# Patient Record
Sex: Female | Born: 1960
Health system: Southern US, Community
[De-identification: ages and names within clinical notes are randomized; demographics above are authoritative.]

## PROBLEM LIST (undated history)

## (undated) DIAGNOSIS — E119 Type 2 diabetes mellitus without complications: Secondary | ICD-10-CM

## (undated) DIAGNOSIS — E039 Hypothyroidism, unspecified: Secondary | ICD-10-CM

## (undated) DIAGNOSIS — M1990 Unspecified osteoarthritis, unspecified site: Secondary | ICD-10-CM

## (undated) DIAGNOSIS — E785 Hyperlipidemia, unspecified: Secondary | ICD-10-CM

## (undated) DIAGNOSIS — E079 Disorder of thyroid, unspecified: Secondary | ICD-10-CM

## (undated) HISTORY — DX: Unspecified osteoarthritis, unspecified site: M19.90

## (undated) HISTORY — DX: Type 2 diabetes mellitus without complications: E11.9

## (undated) HISTORY — DX: Disorder of thyroid, unspecified: E07.9

## (undated) HISTORY — PX: TONSILLECTOMY AND ADENOIDECTOMY: SUR1326

## (undated) HISTORY — DX: Hypothyroidism, unspecified: E03.9

## (undated) HISTORY — PX: OTHER SURGICAL HISTORY: SHX169

## (undated) HISTORY — DX: Hyperlipidemia, unspecified: E78.5

## (undated) HISTORY — PX: WISDOM TOOTH EXTRACTION: SHX21

---

## 2001-06-11 ENCOUNTER — Encounter: Payer: Self-pay | Admitting: Orthopaedic Surgery

## 2001-06-11 ENCOUNTER — Ambulatory Visit (HOSPITAL_COMMUNITY): Admission: RE | Admit: 2001-06-11 | Discharge: 2001-06-11 | Payer: Self-pay | Admitting: Orthopaedic Surgery

## 2001-06-29 ENCOUNTER — Other Ambulatory Visit: Admission: RE | Admit: 2001-06-29 | Discharge: 2001-06-29 | Payer: Self-pay | Admitting: *Deleted

## 2003-07-10 ENCOUNTER — Ambulatory Visit (HOSPITAL_COMMUNITY): Admission: RE | Admit: 2003-07-10 | Discharge: 2003-07-10 | Payer: Self-pay | Admitting: Obstetrics and Gynecology

## 2004-08-08 ENCOUNTER — Ambulatory Visit (HOSPITAL_COMMUNITY): Admission: RE | Admit: 2004-08-08 | Discharge: 2004-08-08 | Payer: Self-pay | Admitting: Obstetrics and Gynecology

## 2004-12-10 ENCOUNTER — Emergency Department (HOSPITAL_COMMUNITY): Admission: EM | Admit: 2004-12-10 | Discharge: 2004-12-10 | Payer: Self-pay | Admitting: Emergency Medicine

## 2004-12-13 ENCOUNTER — Ambulatory Visit (HOSPITAL_COMMUNITY): Admission: RE | Admit: 2004-12-13 | Discharge: 2004-12-13 | Payer: Self-pay | Admitting: Orthopaedic Surgery

## 2004-12-17 ENCOUNTER — Encounter (HOSPITAL_COMMUNITY): Admission: RE | Admit: 2004-12-17 | Discharge: 2005-01-16 | Payer: Self-pay | Admitting: Orthopaedic Surgery

## 2005-01-17 ENCOUNTER — Encounter (HOSPITAL_COMMUNITY): Admission: RE | Admit: 2005-01-17 | Discharge: 2005-02-14 | Payer: Self-pay | Admitting: Orthopaedic Surgery

## 2005-02-18 ENCOUNTER — Encounter (HOSPITAL_COMMUNITY): Admission: RE | Admit: 2005-02-18 | Discharge: 2005-03-20 | Payer: Self-pay | Admitting: Orthopaedic Surgery

## 2005-09-16 ENCOUNTER — Ambulatory Visit (HOSPITAL_COMMUNITY): Admission: RE | Admit: 2005-09-16 | Discharge: 2005-09-16 | Payer: Self-pay | Admitting: Pediatrics

## 2006-06-25 ENCOUNTER — Ambulatory Visit (HOSPITAL_COMMUNITY): Admission: RE | Admit: 2006-06-25 | Discharge: 2006-06-25 | Payer: Self-pay | Admitting: Internal Medicine

## 2006-06-25 ENCOUNTER — Ambulatory Visit: Payer: Self-pay | Admitting: Internal Medicine

## 2006-06-25 ENCOUNTER — Encounter (INDEPENDENT_AMBULATORY_CARE_PROVIDER_SITE_OTHER): Payer: Self-pay | Admitting: Specialist

## 2006-10-08 ENCOUNTER — Ambulatory Visit: Payer: Self-pay | Admitting: Internal Medicine

## 2006-11-16 ENCOUNTER — Ambulatory Visit (HOSPITAL_COMMUNITY): Admission: RE | Admit: 2006-11-16 | Discharge: 2006-11-16 | Payer: Self-pay | Admitting: Pediatrics

## 2007-12-15 ENCOUNTER — Ambulatory Visit (HOSPITAL_COMMUNITY): Admission: RE | Admit: 2007-12-15 | Discharge: 2007-12-15 | Payer: Self-pay | Admitting: Internal Medicine

## 2009-03-27 ENCOUNTER — Ambulatory Visit (HOSPITAL_COMMUNITY): Admission: RE | Admit: 2009-03-27 | Discharge: 2009-03-27 | Payer: Self-pay | Admitting: Pediatrics

## 2010-10-04 NOTE — Op Note (Signed)
Jenna Carroll, Jenna Carroll                ACCOUNT NO.:  [id number]   MEDICAL RECORD NO.:  [id number]          PATIENT TYPE:  AMB   LOCATION:  DAY                           FACILITY:  [facility]   PHYSICIAN:  Lionel December, M.D.    DATE OF BIRTH:  02/03/1961   DATE OF PROCEDURE:  06/25/2006  DATE OF DISCHARGE:                                PROCEDURE NOTE   PROCEDURE:  Colonoscopy with terminal ileoscopy.   ENDOSCOPIST:  Lionel December, M.D.   INDICATION:  Jenna Carroll is a 50 year old Caucasian female who was recently  found to have heme-positive stools.  She presently does not have any  symptoms.  A few months ago she thought she had a single episode of  tarry stool, but she had been taking prescription Naprosyn, which she  uses sparingly.  Family history is negative for colorectal carcinoma.  Procedure and risks were reviewed with the patient and informed consent  was obtained.   MEDICATIONS FOR CONSCIOUS SEDATION:  Demerol 25 mg IV, Versed 6 mg IV,  Zofran 4 mg IV.   FINDINGS:  Procedure was performed in endoscopy suite.  The patient's  vital signs and O2 SATs were monitored during procedure and remained  stable.  The patient was placed in the left lateral decubitus position  and rectal exam was performed.  No abnormality was noted on external or  digital exam.  Pentax videoscope was placed in the rectum and advanced  under vision into sigmoid colon and beyond.  Preparation was excellent.  She had fine mucosal pigmentation consistent melanosis coli.  Scope was  passed into cecum, which was identified by ileocecal valve and  appendiceal orifice.  Mucosa of the cecum and ascending colon appeared  abnormal with a fleshy circumscribed areas, possibly lymphoid  hyperplasia.  Biopsy was taken for routine histology.  Short segment of  TL was also examined and was normal.  As the scope was withdrawn,  colonic mucosa was examined for the 2nd time and there were no polyps,  tumor masses or other mucosal  abnormalities.  Rectal mucosa was normal.  Scope was retroflexed to examine the anorectal junction, which was  unremarkable.  Endoscope was straightened and withdrawn.  The patient  tolerated the procedure well.   FINAL DIAGNOSES:  1. Normal terminal ileum.  2. Abnormal appearance to mucosa of cecum and ascending colon,      possible lymphoid hyperplasia.  Biopsy taken for routine histology.  3. No evidence of polyps, neoplasm or diverticulosis.   RECOMMENDATIONS:  I will be contacting the patient with results of  biopsy and further recommendations.   She should have H&H and Hemoccults in 3 months to make sure this is not  a recurrent or chronic process.      Lionel December, M.D.  Electronically Signed     NR/MEDQ  D:  06/25/2006  T:  06/25/2006  Job:  [id number]   cc:   Francoise Schaumann. Milford Cage DO, FAAP  Fax: [phone]

## 2012-02-17 DIAGNOSIS — E119 Type 2 diabetes mellitus without complications: Secondary | ICD-10-CM

## 2012-02-17 HISTORY — DX: Type 2 diabetes mellitus without complications: E11.9

## 2012-03-16 ENCOUNTER — Other Ambulatory Visit: Payer: Self-pay | Admitting: Family Medicine

## 2012-03-16 ENCOUNTER — Ambulatory Visit (INDEPENDENT_AMBULATORY_CARE_PROVIDER_SITE_OTHER): Payer: 59 | Admitting: Family Medicine

## 2012-03-16 ENCOUNTER — Encounter: Payer: Self-pay | Admitting: Family Medicine

## 2012-03-16 VITALS — BP 100/70 | HR 82 | Resp 15 | Ht 67.0 in | Wt 203.1 lb

## 2012-03-16 DIAGNOSIS — E039 Hypothyroidism, unspecified: Secondary | ICD-10-CM

## 2012-03-16 DIAGNOSIS — E0590 Thyrotoxicosis, unspecified without thyrotoxic crisis or storm: Secondary | ICD-10-CM

## 2012-03-16 DIAGNOSIS — E559 Vitamin D deficiency, unspecified: Secondary | ICD-10-CM

## 2012-03-16 DIAGNOSIS — Z13220 Encounter for screening for lipoid disorders: Secondary | ICD-10-CM

## 2012-03-16 DIAGNOSIS — M129 Arthropathy, unspecified: Secondary | ICD-10-CM

## 2012-03-16 DIAGNOSIS — E669 Obesity, unspecified: Secondary | ICD-10-CM

## 2012-03-16 DIAGNOSIS — Z1239 Encounter for other screening for malignant neoplasm of breast: Secondary | ICD-10-CM

## 2012-03-16 DIAGNOSIS — M1990 Unspecified osteoarthritis, unspecified site: Secondary | ICD-10-CM | POA: Insufficient documentation

## 2012-03-16 LAB — LIPID PANEL
Cholesterol: 209 mg/dL — ABNORMAL HIGH (ref 0–200)
LDL Cholesterol: 122 mg/dL — ABNORMAL HIGH (ref 0–99)
VLDL: 7 mg/dL (ref 0–40)

## 2012-03-16 LAB — COMPREHENSIVE METABOLIC PANEL
ALT: 13 U/L (ref 0–35)
CO2: 26 mEq/L (ref 19–32)
Sodium: 136 mEq/L (ref 135–145)
Total Bilirubin: 1.3 mg/dL — ABNORMAL HIGH (ref 0.3–1.2)
Total Protein: 6.7 g/dL (ref 6.0–8.3)

## 2012-03-16 LAB — CBC
Hemoglobin: 14 g/dL (ref 12.0–15.0)
MCH: 31.7 pg (ref 26.0–34.0)
RBC: 4.41 MIL/uL (ref 3.87–5.11)
WBC: 4.1 10*3/uL (ref 4.0–10.5)

## 2012-03-16 LAB — TSH: TSH: 2.275 u[IU]/mL (ref 0.350–4.500)

## 2012-03-16 MED ORDER — LEVOTHYROXINE SODIUM 137 MCG PO TABS: 137.0000 ug | ORAL_TABLET | Freq: Every day | ORAL | Status: DC

## 2012-03-16 NOTE — Assessment & Plan Note (Signed)
Discussed importance of diet and exercise, she is motivated to restart her exercise routine.  Goal set for next visit 20lbs weight loss

## 2012-03-16 NOTE — Assessment & Plan Note (Signed)
Vitamin D deficient in the past. She's to start calcium and vitamin D

## 2012-03-16 NOTE — Progress Notes (Signed)
  Subjective:    Patient ID: Jenna Carroll, female    DOB: Feb 04, 1961, 51 y.o.   MRN: [id number]  HPI Patient here to establish care. Previous PCP Dr. Stephania Fragmin at [facility]. [facility] Medications and history reviewed. Patient is a Garment/textile technologist at [employer]. Hypothyroidism. Patient was diagnosed with Hashimoto's in 2007. She's been well-controlled on levothyroxine she does better on the generic brand. Rheumatoid arthritis. Patient states she was diagnosed the mild form of rheumatoid arthritis. She had a rheumatoid attack in her right eye which was evaluated by ophthalmology a few years ago. She subsequently had markers done which were positive rheumatoid factor she was evaluated by a rheumatologist however which showed a severe mild disease and that she could use anti-inflammatories. She was given prescription for Celebrex however has not had this filled. She typically uses Aleve   Review of Systems  GEN- denies fatigue, fever, weight loss,weakness, recent illness HEENT- denies eye drainage, change in vision, nasal discharge, CVS- denies chest pain, palpitations RESP- denies SOB, cough, wheeze ABD- denies N/V, change in stools, abd pain GU- denies dysuria, hematuria, dribbling, incontinence MSK- denies joint pain, muscle aches, injury Neuro- denies headache, dizziness, syncope, seizure activity      Objective:   Physical Exam GEN- NAD, alert and oriented x3 HEENT- PERRL, EOMI, non injected sclera, pink conjunctiva, MMM, oropharynx clear Neck- Supple, no thyromegaly CVS- RRR, no murmur RESP-CTAB ABS-NABS,soft,NT,ND EXT- No edema Pulses- Radial, DP- 2+ Psych- normal affect and mood        Assessment & Plan:

## 2012-03-16 NOTE — Assessment & Plan Note (Signed)
When necessary use of over-the-counter anti-inflammatories. We will use Celebrex if needed

## 2012-03-16 NOTE — Assessment & Plan Note (Signed)
Check baseline labs including thyroid studies. Continue Synthroid

## 2012-03-16 NOTE — Patient Instructions (Signed)
Get the fasting labs done Continue current medications Add Calcium and Vit D (800IU)  Schedule Mammogram F/U Dr. Emelda Fear for PAP Smear  F/U 6 months

## 2012-03-17 LAB — HEMOGLOBIN A1C
Hgb A1c MFr Bld: 7 % — ABNORMAL HIGH (ref <5.7)
Mean Plasma Glucose: 154 mg/dL — ABNORMAL HIGH (ref <117)

## 2012-03-18 ENCOUNTER — Telehealth: Payer: Self-pay | Admitting: Family Medicine

## 2012-03-18 ENCOUNTER — Encounter: Payer: Self-pay | Admitting: Family Medicine

## 2012-03-18 MED ORDER — METFORMIN HCL ER 500 MG PO TB24: 500.0000 mg | ORAL_TABLET | Freq: Every day | ORAL | Status: DC

## 2012-03-18 NOTE — Telephone Encounter (Signed)
Spoke with patient. Review labs new diagnosis of diabetes mellitus with an A1c of 7%. She also has mild hyperlipidemia with an LDL of 122. She started an exercise program per her recent visit. I will start her on metformin 500 mg daily. She is committed to changing her diet and increase in activity in order to control her diabetes mellitus. I believe with the changes we will be able to eventually get her off of oral medications and controlled with diet. She will test her blood sugars 3 times a week fasting and record. She's to call for any hyperglycemia or hypoglycemia. We will recheck A1c in 12 weeks. With new diagnosis and plan per above we will hold on addition of statin therapy at this time

## 2012-04-14 ENCOUNTER — Other Ambulatory Visit (HOSPITAL_COMMUNITY)
Admission: RE | Admit: 2012-04-14 | Discharge: 2012-04-14 | Disposition: A | Discharge status: Home / Self Care | Payer: 59 | Source: Ambulatory Visit | Attending: Obstetrics and Gynecology | Admitting: Obstetrics and Gynecology

## 2012-04-14 ENCOUNTER — Other Ambulatory Visit: Payer: Self-pay | Admitting: Adult Health

## 2012-04-14 DIAGNOSIS — Z01419 Encounter for gynecological examination (general) (routine) without abnormal findings: Secondary | ICD-10-CM | POA: Insufficient documentation

## 2012-04-14 DIAGNOSIS — Z1151 Encounter for screening for human papillomavirus (HPV): Secondary | ICD-10-CM | POA: Insufficient documentation

## 2012-09-16 ENCOUNTER — Other Ambulatory Visit: Payer: Self-pay | Admitting: Family Medicine

## 2012-09-20 ENCOUNTER — Other Ambulatory Visit: Payer: Self-pay | Admitting: Family Medicine

## 2012-09-20 DIAGNOSIS — E785 Hyperlipidemia, unspecified: Secondary | ICD-10-CM

## 2012-09-20 DIAGNOSIS — R7303 Prediabetes: Secondary | ICD-10-CM

## 2012-09-20 DIAGNOSIS — E039 Hypothyroidism, unspecified: Secondary | ICD-10-CM

## 2012-09-22 ENCOUNTER — Telehealth: Payer: Self-pay | Admitting: Family Medicine

## 2012-09-22 NOTE — Addendum Note (Signed)
Addended by: Kandis Fantasia B on: 09/22/2012 02:24 PM   Modules accepted: Orders

## 2012-09-22 NOTE — Progress Notes (Signed)
Called and let msg for patient.  Notified her that she does need to complete labs prior to appt and they are fasting.  Also notified her that the manufacturer of her levothyroxine has changed and she may see a change in the pill.

## 2012-09-22 NOTE — Telephone Encounter (Signed)
See previous msg for response.

## 2012-09-23 LAB — BASIC METABOLIC PANEL
CO2: 22 mEq/L (ref 19–32)
Glucose, Bld: 88 mg/dL (ref 70–99)
Potassium: 3.9 mEq/L (ref 3.5–5.3)
Sodium: 136 mEq/L (ref 135–145)

## 2012-09-23 LAB — HEMOGLOBIN A1C
Hgb A1c MFr Bld: 6.4 % — ABNORMAL HIGH (ref <5.7)
Mean Plasma Glucose: 137 mg/dL — ABNORMAL HIGH (ref <117)

## 2012-09-24 ENCOUNTER — Ambulatory Visit (HOSPITAL_COMMUNITY): Payer: 59

## 2012-09-24 ENCOUNTER — Ambulatory Visit (HOSPITAL_COMMUNITY)
Admission: RE | Admit: 2012-09-24 | Discharge: 2012-09-24 | Disposition: A | Discharge status: Home / Self Care | Payer: 59 | Source: Ambulatory Visit | Attending: Family Medicine | Admitting: Family Medicine

## 2012-09-24 DIAGNOSIS — Z1239 Encounter for other screening for malignant neoplasm of breast: Secondary | ICD-10-CM

## 2012-09-24 DIAGNOSIS — Z1231 Encounter for screening mammogram for malignant neoplasm of breast: Secondary | ICD-10-CM | POA: Insufficient documentation

## 2012-09-28 ENCOUNTER — Encounter: Payer: Self-pay | Admitting: Family Medicine

## 2012-09-28 ENCOUNTER — Ambulatory Visit (INDEPENDENT_AMBULATORY_CARE_PROVIDER_SITE_OTHER): Payer: 59 | Admitting: Family Medicine

## 2012-09-28 VITALS — BP 114/78 | HR 79 | Resp 16 | Wt 193.0 lb

## 2012-09-28 DIAGNOSIS — R809 Proteinuria, unspecified: Secondary | ICD-10-CM

## 2012-09-28 DIAGNOSIS — D239 Other benign neoplasm of skin, unspecified: Secondary | ICD-10-CM

## 2012-09-28 DIAGNOSIS — E669 Obesity, unspecified: Secondary | ICD-10-CM

## 2012-09-28 DIAGNOSIS — D229 Melanocytic nevi, unspecified: Secondary | ICD-10-CM

## 2012-09-28 DIAGNOSIS — N390 Urinary tract infection, site not specified: Secondary | ICD-10-CM

## 2012-09-28 DIAGNOSIS — E039 Hypothyroidism, unspecified: Secondary | ICD-10-CM

## 2012-09-28 DIAGNOSIS — R7302 Impaired glucose tolerance (oral): Secondary | ICD-10-CM

## 2012-09-28 DIAGNOSIS — E119 Type 2 diabetes mellitus without complications: Secondary | ICD-10-CM

## 2012-09-28 LAB — POCT URINALYSIS DIPSTICK
Bilirubin, UA: NEGATIVE
Ketones, UA: NEGATIVE
Nitrite, UA: POSITIVE
Protein, UA: 100

## 2012-09-28 MED ORDER — LEVOTHYROXINE SODIUM 137 MCG PO TABS: ORAL_TABLET | ORAL | Status: DC

## 2012-09-28 MED ORDER — CEPHALEXIN 500 MG PO CAPS: 500.0000 mg | ORAL_CAPSULE | Freq: Two times a day (BID) | ORAL | Status: DC

## 2012-09-28 NOTE — Patient Instructions (Addendum)
Continue to work on weight loss Current BMI 30 Cholesterol looks great You can check CBG Fasting a few times a week Dermatology referral Keflex for UTI F/U 6 months

## 2012-09-29 DIAGNOSIS — N390 Urinary tract infection, site not specified: Secondary | ICD-10-CM | POA: Insufficient documentation

## 2012-09-29 DIAGNOSIS — R809 Proteinuria, unspecified: Secondary | ICD-10-CM | POA: Insufficient documentation

## 2012-09-29 DIAGNOSIS — D229 Melanocytic nevi, unspecified: Secondary | ICD-10-CM | POA: Insufficient documentation

## 2012-09-29 DIAGNOSIS — E119 Type 2 diabetes mellitus without complications: Secondary | ICD-10-CM | POA: Insufficient documentation

## 2012-09-29 LAB — MICROALBUMIN / CREATININE URINE RATIO
Microalb Creat Ratio: 113.5 mg/g — ABNORMAL HIGH (ref 0.0–30.0)
Microalb, Ur: 19.01 mg/dL — ABNORMAL HIGH (ref 0.00–1.89)

## 2012-09-29 MED ORDER — LISINOPRIL 5 MG PO TABS: 5.0000 mg | ORAL_TABLET | Freq: Every day | ORAL | Status: DC

## 2012-09-29 NOTE — Progress Notes (Signed)
  Subjective:    Patient ID: KESTREL MIS, female    DOB: [birth year], 53 y.o.   MRN: [id number]  HPI  Pt here to f/u chronic medical problems DM- fastings 90- low 100's, did not tolerate Metformin after 6 weeks still had diarrhea. Has been watching diet and losing weight. Weight was done to 185lbs. Dysuria past 3 days with frequency, no fever, no abd pain Request referral to have moles looked at by dermatology Fasting labs reviewed  Review of Systems  GEN- denies fatigue, fever, weight loss,weakness, recent illness HEENT- denies eye drainage, change in vision, nasal discharge, CVS- denies chest pain, palpitations RESP- denies SOB, cough, wheeze ABD- denies N/V, change in stools, abd pain GU- + dysuria,denies  hematuria, dribbling, incontinence MSK- denies joint pain, muscle aches, injury Neuro- denies headache, dizziness, syncope, seizure activity      Objective:   Physical Exam GEN- NAD, alert and oriented x3 HEENT- PERRL, EOMI, non injected sclera, pink conjunctiva, MMM, oropharynx clear Neck- Supple, no thyromegaly CVS- RRR, no murmur RESP-CTAB ABD-NABS,soft,NT,ND, no CVA tenderness EXT- No edema Pulses- Radial, DP- 2+ Skin-brown macules, few keratosis, raised lesion on left leg       Assessment & Plan:

## 2012-09-29 NOTE — Assessment & Plan Note (Signed)
Referral to dermatology.  

## 2012-09-29 NOTE — Assessment & Plan Note (Signed)
Start low dose ACEI, recheck 6 months Renal function normal

## 2012-09-29 NOTE — Assessment & Plan Note (Signed)
Diet controlled, did not tolerate metformin

## 2012-09-29 NOTE — Assessment & Plan Note (Signed)
No change to dose

## 2012-09-29 NOTE — Assessment & Plan Note (Signed)
Weight loss noted, she continues to work on [employer] and diet Very motivated

## 2012-09-29 NOTE — Assessment & Plan Note (Signed)
Keflex given

## 2012-10-14 ENCOUNTER — Emergency Department (HOSPITAL_COMMUNITY)
Admission: EM | Admit: 2012-10-14 | Discharge: 2012-10-14 | Disposition: A | Discharge status: Home / Self Care | Payer: PRIVATE HEALTH INSURANCE | Attending: Emergency Medicine | Admitting: Emergency Medicine

## 2012-10-14 ENCOUNTER — Encounter (HOSPITAL_COMMUNITY): Payer: Self-pay | Admitting: *Deleted

## 2012-10-14 DIAGNOSIS — R209 Unspecified disturbances of skin sensation: Secondary | ICD-10-CM | POA: Insufficient documentation

## 2012-10-14 DIAGNOSIS — Z862 Personal history of diseases of the blood and blood-forming organs and certain disorders involving the immune mechanism: Secondary | ICD-10-CM | POA: Insufficient documentation

## 2012-10-14 DIAGNOSIS — E119 Type 2 diabetes mellitus without complications: Secondary | ICD-10-CM | POA: Insufficient documentation

## 2012-10-14 DIAGNOSIS — Y939 Activity, unspecified: Secondary | ICD-10-CM | POA: Insufficient documentation

## 2012-10-14 DIAGNOSIS — E079 Disorder of thyroid, unspecified: Secondary | ICD-10-CM | POA: Insufficient documentation

## 2012-10-14 DIAGNOSIS — Z79899 Other long term (current) drug therapy: Secondary | ICD-10-CM | POA: Insufficient documentation

## 2012-10-14 DIAGNOSIS — IMO0002 Reserved for concepts with insufficient information to code with codable children: Secondary | ICD-10-CM | POA: Insufficient documentation

## 2012-10-14 DIAGNOSIS — Z8739 Personal history of other diseases of the musculoskeletal system and connective tissue: Secondary | ICD-10-CM | POA: Insufficient documentation

## 2012-10-14 DIAGNOSIS — Y9289 Other specified places as the place of occurrence of the external cause: Secondary | ICD-10-CM | POA: Insufficient documentation

## 2012-10-14 DIAGNOSIS — S79919A Unspecified injury of unspecified hip, initial encounter: Secondary | ICD-10-CM | POA: Insufficient documentation

## 2012-10-14 DIAGNOSIS — Y990 Civilian activity done for income or pay: Secondary | ICD-10-CM | POA: Insufficient documentation

## 2012-10-14 DIAGNOSIS — R202 Paresthesia of skin: Secondary | ICD-10-CM

## 2012-10-14 DIAGNOSIS — Z8639 Personal history of other endocrine, nutritional and metabolic disease: Secondary | ICD-10-CM | POA: Insufficient documentation

## 2012-10-14 NOTE — ED Notes (Signed)
Bumped into a piece of equipment while at work, twisting the wrong way.  C/o severe pain to left hip radiating down left leg.  States felt numbness, feeling/pain coming back with movement.

## 2012-10-14 NOTE — ED Notes (Addendum)
Charted in error.

## 2012-10-14 NOTE — ED Provider Notes (Signed)
History     This chart was scribed for Joya Gaskins, MD, MD by Smitty Pluck, ED Scribe. The patient was seen in room APA02/APA02 and the patient's care was started at 10:50 AM.   CSN: [id number]  Arrival date & time 10/14/12  1032      Chief Complaint  Patient presents with  . Leg Pain     Patient is a 52 y.o. female presenting with leg pain. The history is provided by the patient and medical records. No language interpreter was used.  Leg Pain Location:  Hip Time since incident:  15 minutes Hip location:  L hip Pain details:    Radiates to:  L leg   Severity:  Severe   Onset quality:  Sudden   Timing:  Intermittent   Progression:  Resolved Chronicity:  New Dislocation: no   Foreign body present:  No foreign bodies Ineffective treatments:  None tried Associated symptoms: no back pain and no fever    HPI Comments: Jenna Carroll is a 52 y.o. female who presents to the Emergency Department complaining of intermittent, severe left hip pain onset today 15 minutes ago suddenly after hitting handle of laser machine with left hip today at work. She reports hip pain radiated to left leg and numbness in left leg. Pt mentions that her symptoms are improving and she does not currently have any pain. She reports hx of left leg pain. Pt denies fall, fever, chills, nausea, vomiting, diarrhea, weakness, cough, SOB and any other pain.   Past Medical History  Diagnosis Date  . Thyroid disease   . Arthritis   . DM (diabetes mellitus) Oct 2013  . Hyperlipidemia, mild     Past Surgical History  Procedure Laterality Date  . Left ankle    . Tonsillectomy and adenoidectomy      Family History  Problem Relation Age of Onset  . Diabetes Mother   . Heart disease Mother   . Hypertension Mother   . Hyperlipidemia Mother   . Heart disease Father   . Arthritis Father     Rheumatoid  . Hyperlipidemia Brother     History  Substance Use Topics  . Smoking status: Never Smoker   .  Smokeless tobacco: Not on file  . Alcohol Use: Yes     Comment: rare    OB History   Grav Para Term Preterm Abortions TAB SAB Ect Mult Living                  Review of Systems  Constitutional: Negative for fever and chills.  Musculoskeletal: Positive for arthralgias. Negative for back pain.  Neurological: Positive for numbness.  All other systems reviewed and are negative.    Allergies  Review of patient's allergies indicates no known allergies.  Home Medications   Current Outpatient Rx  Name  Route  Sig  Dispense  Refill  . cephALEXin (KEFLEX) 500 MG capsule   Oral   Take 1 capsule (500 mg total) by mouth 2 (two) times daily.   10 capsule   0   . levothyroxine (SYNTHROID, LEVOTHROID) 137 MCG tablet      TAKE ONE TABLET BY MOUTH EVERY DAY   90 tablet   2   . lisinopril (PRINIVIL,ZESTRIL) 5 MG tablet   Oral   Take 1 tablet (5 mg total) by mouth daily.   30 tablet   6     BP 142/89  Pulse 85  Temp(Src) 98.4 F (36.9 C) (Oral)  Resp 18  Ht 5\' 7"  (1.702 m)  Wt 190 lb (86.183 kg)  BMI 29.75 kg/m2  SpO2 99%  Physical Exam  Nursing note and vitals reviewed. CONSTITUTIONAL: Well developed/well nourished HEAD: Normocephalic/atraumatic EYES: EOMI/PERRL ENMT: Mucous membranes moist NECK: supple no meningeal signs SPINE:entire spine nontender, No bruising/crepitance/stepoffs noted to spine CV: S1/S2 noted, no murmurs/rubs/gallops noted LUNGS: Lungs are clear to auscultation bilaterally, no apparent distress ABDOMEN: soft, nontender, no rebound or guarding GU:no cva tenderness NEURO: Pt is awake/alert, paresthesia throughout left leg but able to move toes EXTREMITIES: pulses normal, left buttock pain. No bruising SKIN: warm, color normal PSYCH: no abnormalities of mood noted   ED Course  Procedures ( DIAGNOSTIC STUDIES: Oxygen Saturation is 99% on room air, normal by my interpretation.    COORDINATION OF CARE: 10:54 AM Discussed ED treatment with pt  and pt agrees.   11:41 AM Pt with abrupt onset of paresthesias after hitting object at work (no falls reported Pt feeling improved, able move the left LE  And reports sensation is returning   Symptoms nearly resolved She is ambulatory She can move the left hip without significant reproduction of pain She can flex hip, flex/extend left knee and plantar/dorsi flex left foot She does not want pain meds She has seen dr Hilda Lias previously and would like to see him again  MDM  Nursing notes including past medical history and social history reviewed and considered in documentation       I personally performed the services described in this documentation, which was scribed in my presence. The recorded information has been reviewed and is accurate.      Joya Gaskins, MD 10/14/12 1324

## 2012-11-17 ENCOUNTER — Other Ambulatory Visit (HOSPITAL_COMMUNITY): Payer: Self-pay | Admitting: Orthopaedic Surgery

## 2012-11-17 DIAGNOSIS — M545 Low back pain: Secondary | ICD-10-CM

## 2012-11-22 ENCOUNTER — Ambulatory Visit (HOSPITAL_COMMUNITY)
Admission: RE | Admit: 2012-11-22 | Discharge: 2012-11-22 | Disposition: A | Discharge status: Home / Self Care | Payer: PRIVATE HEALTH INSURANCE | Source: Ambulatory Visit | Attending: Orthopaedic Surgery | Admitting: Orthopaedic Surgery

## 2012-11-22 DIAGNOSIS — M545 Low back pain, unspecified: Secondary | ICD-10-CM | POA: Insufficient documentation

## 2012-11-22 DIAGNOSIS — M47817 Spondylosis without myelopathy or radiculopathy, lumbosacral region: Secondary | ICD-10-CM | POA: Insufficient documentation

## 2013-03-24 ENCOUNTER — Other Ambulatory Visit: Payer: Self-pay

## 2015-06-21 ENCOUNTER — Other Ambulatory Visit (HOSPITAL_COMMUNITY): Payer: Self-pay | Admitting: Internal Medicine

## 2015-06-21 DIAGNOSIS — Z1231 Encounter for screening mammogram for malignant neoplasm of breast: Secondary | ICD-10-CM

## 2015-06-22 ENCOUNTER — Ambulatory Visit (HOSPITAL_COMMUNITY)
Admission: RE | Admit: 2015-06-22 | Discharge: 2015-06-22 | Disposition: A | Discharge status: Home / Self Care | Payer: 59 | Source: Ambulatory Visit | Attending: Internal Medicine | Admitting: Internal Medicine

## 2015-06-22 DIAGNOSIS — Z1231 Encounter for screening mammogram for malignant neoplasm of breast: Secondary | ICD-10-CM | POA: Diagnosis not present

## 2015-08-06 MED FILL — LEVOTHYROXINE 150 MCG TAB: 150 | 90 days supply | Qty: 90 | Fill #1

## 2015-09-12 DIAGNOSIS — E559 Vitamin D deficiency, unspecified: Secondary | ICD-10-CM | POA: Diagnosis not present

## 2015-09-12 DIAGNOSIS — E119 Type 2 diabetes mellitus without complications: Secondary | ICD-10-CM | POA: Diagnosis not present

## 2015-09-12 DIAGNOSIS — E039 Hypothyroidism, unspecified: Secondary | ICD-10-CM | POA: Diagnosis not present

## 2015-10-29 MED FILL — LEVOTHYROXINE 150 MCG TAB: 150 | 90 days supply | Qty: 90 | Fill #0

## 2015-11-07 DIAGNOSIS — E039 Hypothyroidism, unspecified: Secondary | ICD-10-CM | POA: Diagnosis not present

## 2015-11-07 DIAGNOSIS — E119 Type 2 diabetes mellitus without complications: Secondary | ICD-10-CM | POA: Diagnosis not present

## 2015-11-07 DIAGNOSIS — E669 Obesity, unspecified: Secondary | ICD-10-CM | POA: Diagnosis not present

## 2015-11-07 MED FILL — CELECOXIB 200 MG CAPSULE: 200 | 30 days supply | Qty: 30 | Fill #0

## 2015-11-07 MED FILL — METAXALONE 800 MG TABLET: 800 | 10 days supply | Qty: 30 | Fill #0

## 2016-01-30 DIAGNOSIS — H524 Presbyopia: Secondary | ICD-10-CM | POA: Diagnosis not present

## 2016-02-04 MED FILL — LEVOTHYROXINE 150 MCG TAB: 150 | 90 days supply | Qty: 90 | Fill #1

## 2016-02-12 DIAGNOSIS — Z713 Dietary counseling and surveillance: Secondary | ICD-10-CM | POA: Diagnosis not present

## 2016-02-12 DIAGNOSIS — E559 Vitamin D deficiency, unspecified: Secondary | ICD-10-CM | POA: Diagnosis not present

## 2016-02-12 DIAGNOSIS — E039 Hypothyroidism, unspecified: Secondary | ICD-10-CM | POA: Diagnosis not present

## 2016-02-12 DIAGNOSIS — E119 Type 2 diabetes mellitus without complications: Secondary | ICD-10-CM | POA: Diagnosis not present

## 2016-02-12 MED FILL — TRUE METRIX GLUCOSE TEST ST: 90 days supply | Qty: 100 | Fill #1

## 2016-02-12 MED FILL — TRUEplus LANCETS 30G MISC: 90 days supply | Qty: 100 | Fill #1

## 2016-03-11 DIAGNOSIS — D2271 Melanocytic nevi of right lower limb, including hip: Secondary | ICD-10-CM | POA: Diagnosis not present

## 2016-03-11 DIAGNOSIS — D2372 Other benign neoplasm of skin of left lower limb, including hip: Secondary | ICD-10-CM | POA: Diagnosis not present

## 2016-03-11 DIAGNOSIS — D2262 Melanocytic nevi of left upper limb, including shoulder: Secondary | ICD-10-CM | POA: Diagnosis not present

## 2016-03-11 DIAGNOSIS — H903 Sensorineural hearing loss, bilateral: Secondary | ICD-10-CM | POA: Diagnosis not present

## 2016-03-11 DIAGNOSIS — D225 Melanocytic nevi of trunk: Secondary | ICD-10-CM | POA: Diagnosis not present

## 2016-03-11 DIAGNOSIS — D2261 Melanocytic nevi of right upper limb, including shoulder: Secondary | ICD-10-CM | POA: Diagnosis not present

## 2016-03-11 DIAGNOSIS — D2272 Melanocytic nevi of left lower limb, including hip: Secondary | ICD-10-CM | POA: Diagnosis not present

## 2016-03-11 DIAGNOSIS — L821 Other seborrheic keratosis: Secondary | ICD-10-CM | POA: Diagnosis not present

## 2016-03-11 DIAGNOSIS — D1801 Hemangioma of skin and subcutaneous tissue: Secondary | ICD-10-CM | POA: Diagnosis not present

## 2016-03-26 DIAGNOSIS — H903 Sensorineural hearing loss, bilateral: Secondary | ICD-10-CM | POA: Diagnosis not present

## 2016-04-08 ENCOUNTER — Other Ambulatory Visit (HOSPITAL_COMMUNITY)
Admission: RE | Admit: 2016-04-08 | Discharge: 2016-04-08 | Disposition: A | Discharge status: Home / Self Care | Payer: 59 | Source: Ambulatory Visit | Attending: Adult Health | Admitting: Adult Health

## 2016-04-08 ENCOUNTER — Encounter: Payer: Self-pay | Admitting: Adult Health

## 2016-04-08 ENCOUNTER — Ambulatory Visit (INDEPENDENT_AMBULATORY_CARE_PROVIDER_SITE_OTHER): Payer: 59 | Admitting: Adult Health

## 2016-04-08 VITALS — BP 102/60 | HR 58 | Ht 67.5 in | Wt 185.0 lb

## 2016-04-08 DIAGNOSIS — Z1211 Encounter for screening for malignant neoplasm of colon: Secondary | ICD-10-CM

## 2016-04-08 DIAGNOSIS — Z01419 Encounter for gynecological examination (general) (routine) without abnormal findings: Secondary | ICD-10-CM | POA: Diagnosis not present

## 2016-04-08 DIAGNOSIS — Z780 Asymptomatic menopausal state: Secondary | ICD-10-CM

## 2016-04-08 DIAGNOSIS — Z1151 Encounter for screening for human papillomavirus (HPV): Secondary | ICD-10-CM | POA: Insufficient documentation

## 2016-04-08 DIAGNOSIS — Z975 Presence of (intrauterine) contraceptive device: Secondary | ICD-10-CM | POA: Insufficient documentation

## 2016-04-08 LAB — HEMOCCULT GUIAC POC 1CARD (OFFICE): FECAL OCCULT BLD: NEGATIVE

## 2016-04-08 NOTE — Progress Notes (Signed)
Patient ID: Jenna Carroll, female   DOB: 1960-11-27, 55 y.o.   MRN: [id number] History of Present Illness:  Jenna Carroll is a 55 year old white female, married in for well woman gyn exam and pap.Last pap was 04/14/12 and was normal with negative HPV. PCP is Dr Anastasio Champion.   Current Medications, Allergies, Past Medical History, Past Surgical History, Family History and Social History were reviewed in [employer] record.     Review of Systems: Patient denies any headaches, hearing loss, fatigue, blurred vision, shortness of breath, chest pain, abdominal pain, problems with bowel movements, urination, or intercourse. No joint pain or mood swings.Skipping periods now, has para gard IUD for about 20 years now.    Physical Exam:BP 102/60 (BP Location: Left Arm, Patient Position: Sitting, Cuff Size: Normal)   Pulse (!) 58   Ht 5' 7.5" (1.715 m)   Wt 185 lb (83.9 kg)   LMP 11/07/2015   BMI 28.55 kg/m  General:  Well developed, well nourished, no acute distress Skin:  Warm and dry Neck:  Midline trachea, normal thyroid, good ROM, no lymphadenopathy Lungs; Clear to auscultation bilaterally Breast:  No dominant palpable mass, retraction, or nipple discharge Cardiovascular: Regular rate and rhythm Abdomen:  Soft, non tender, no hepatosplenomegaly Pelvic:  External genitalia is normal in appearance, no lesions.  The vagina is normal in appearance. Urethra has no lesions or masses. The cervix is smooth, +IUD strings at os, pap with HPV performed.  Uterus is felt to be normal size, shape, and contour.  No adnexal masses or tenderness noted.Bladder is non tender, no masses felt. Rectal: Good sphincter tone, no polyps, or hemorrhoids felt.  Hemoccult negative. Extremities/musculoskeletal:  No swelling or varicosities noted, no clubbing or cyanosis Psych:  No mood changes, alert and cooperative,seems happy PHQ 2 score 0.  Impression: 1. Encounter for gynecological examination with  Papanicolaou smear of cervix   2. IUD (intrauterine device) in place   3. Menopause      Plan: Check FSH, if PM can remove IUD Physical in 1 year, pap in 3 if normal  Mammogram yearly Colonoscopy 2018 Labs with PCP

## 2016-04-08 NOTE — Patient Instructions (Signed)
Physical in 1 year, pap in 3 if normal  Get FSH,will talk when results back Mammogram yearly Colonoscopy 2018 Labs with PCP

## 2016-04-14 LAB — CYTOLOGY - PAP
Diagnosis: NEGATIVE
HPV: NOT DETECTED

## 2016-04-29 DIAGNOSIS — M79672 Pain in left foot: Secondary | ICD-10-CM | POA: Diagnosis not present

## 2016-04-29 DIAGNOSIS — E119 Type 2 diabetes mellitus without complications: Secondary | ICD-10-CM | POA: Diagnosis not present

## 2016-04-29 DIAGNOSIS — M7750 Other enthesopathy of unspecified foot: Secondary | ICD-10-CM | POA: Diagnosis not present

## 2016-05-05 DIAGNOSIS — N959 Unspecified menopausal and perimenopausal disorder: Secondary | ICD-10-CM | POA: Diagnosis not present

## 2016-05-05 DIAGNOSIS — E039 Hypothyroidism, unspecified: Secondary | ICD-10-CM | POA: Diagnosis not present

## 2016-05-05 DIAGNOSIS — E559 Vitamin D deficiency, unspecified: Secondary | ICD-10-CM | POA: Diagnosis not present

## 2016-05-05 DIAGNOSIS — E119 Type 2 diabetes mellitus without complications: Secondary | ICD-10-CM | POA: Diagnosis not present

## 2016-05-05 DIAGNOSIS — Z0389 Encounter for observation for other suspected diseases and conditions ruled out: Secondary | ICD-10-CM | POA: Diagnosis not present

## 2016-05-05 DIAGNOSIS — Z0000 Encounter for general adult medical examination without abnormal findings: Secondary | ICD-10-CM | POA: Diagnosis not present

## 2016-05-05 MED FILL — LEVOTHYROXINE 150 MCG TAB: 150 | 90 days supply | Qty: 90 | Fill #0

## 2016-05-07 DIAGNOSIS — Z23 Encounter for immunization: Secondary | ICD-10-CM | POA: Diagnosis not present

## 2016-05-15 ENCOUNTER — Encounter: Payer: Self-pay | Admitting: Adult Health

## 2016-05-15 ENCOUNTER — Ambulatory Visit (INDEPENDENT_AMBULATORY_CARE_PROVIDER_SITE_OTHER): Payer: 59 | Admitting: Adult Health

## 2016-05-15 VITALS — BP 134/67 | HR 61 | Ht 67.0 in | Wt 189.0 lb

## 2016-05-15 DIAGNOSIS — Z30432 Encounter for removal of intrauterine contraceptive device: Secondary | ICD-10-CM

## 2016-05-15 DIAGNOSIS — Z780 Asymptomatic menopausal state: Secondary | ICD-10-CM

## 2016-05-15 NOTE — Progress Notes (Signed)
Subjective:     Patient ID: CASANDRA ROBERS, female   DOB: January 30, 1961, 55 y.o.   MRN: [id number]  HPI Kemonie is a 55 year old white female married in for IUD removal.She has had para gard for over 20 years, no period since June and [facility] 102.6.Has occasional hot flash and wakes up a about 2 am and decreased energy at times   Review of Systems For IUD removal Has occasional hot flash and wakes up a about 2 am and decreased energy at times  Reviewed past medical,surgical, social and family history. Reviewed medications and allergies.     Objective:   Physical Exam BP 134/67 (BP Location: Left Arm, Patient Position: Sitting, Cuff Size: Normal)   Pulse 61   Ht 5\' 7"  (1.702 m)   Wt 189 lb (85.7 kg)   LMP 11/07/2015   BMI 29.60 kg/m   PHQ 2 score 0.FSH was 54.6 at Dr Lanice Shirts office. Skin warm and dry.Pelvic: external genitalia is normal in appearance no lesions, vagina: pink, with good moisture and some loss of rugae,urethra has no lesions or masses noted, cervix:smooth, IUD strings seen and grasped with forceps, pt asked to cough and IUD easily removed, uterus: normal size, shape and contour, non tender, no masses felt, adnexa: no masses or tenderness noted. Bladder is non tender and no masses felt.   Discussed risk and benefits of HRT, declines for now.  Assessment:     1. Encounter for IUD removal   2. Postmenopausal       Plan:     Follow up prn

## 2016-05-15 NOTE — Patient Instructions (Signed)
Follow up prn

## 2016-06-17 ENCOUNTER — Encounter (INDEPENDENT_AMBULATORY_CARE_PROVIDER_SITE_OTHER): Payer: Self-pay | Admitting: *Deleted

## 2016-09-10 DIAGNOSIS — E559 Vitamin D deficiency, unspecified: Secondary | ICD-10-CM | POA: Diagnosis not present

## 2016-09-10 DIAGNOSIS — E119 Type 2 diabetes mellitus without complications: Secondary | ICD-10-CM | POA: Diagnosis not present

## 2016-09-10 DIAGNOSIS — E039 Hypothyroidism, unspecified: Secondary | ICD-10-CM | POA: Diagnosis not present

## 2016-09-10 DIAGNOSIS — N959 Unspecified menopausal and perimenopausal disorder: Secondary | ICD-10-CM | POA: Diagnosis not present

## 2016-09-17 MED FILL — LEVOTHYROXINE 150 MCG TAB: 150 | 90 days supply | Qty: 90 | Fill #1

## 2016-09-18 MED FILL — ACCU-CHEK GUIDE TEST STRIP: 90 days supply | Qty: 100 | Fill #0

## 2016-09-18 MED FILL — ACCU-CHEK FASTCLIX LANCETS: 90 days supply | Qty: 102 | Fill #0

## 2017-01-13 DIAGNOSIS — R5383 Other fatigue: Secondary | ICD-10-CM | POA: Diagnosis not present

## 2017-01-13 DIAGNOSIS — E559 Vitamin D deficiency, unspecified: Secondary | ICD-10-CM | POA: Diagnosis not present

## 2017-01-13 DIAGNOSIS — E039 Hypothyroidism, unspecified: Secondary | ICD-10-CM | POA: Diagnosis not present

## 2017-01-13 DIAGNOSIS — N959 Unspecified menopausal and perimenopausal disorder: Secondary | ICD-10-CM | POA: Diagnosis not present

## 2017-01-13 DIAGNOSIS — E119 Type 2 diabetes mellitus without complications: Secondary | ICD-10-CM | POA: Diagnosis not present

## 2017-01-13 MED FILL — PROGESTERONE 100 MG CAPSULE: 100 | 30 days supply | Qty: 30 | Fill #0

## 2017-01-13 MED FILL — LEVOTHYROXINE 150 MCG TAB: 150 | 90 days supply | Qty: 90 | Fill #0

## 2017-01-13 MED FILL — ESTRADIOL 1 MG TABLET: 1 | 30 days supply | Qty: 30 | Fill #0

## 2017-01-21 MED FILL — metFORMIN HCL 500 MG TABS: 500 | 30 days supply | Qty: 60 | Fill #0

## 2017-02-11 MED FILL — PROGESTERONE 100 MG CAPSULE: 100 | 30 days supply | Qty: 30 | Fill #1

## 2017-02-11 MED FILL — ESTRADIOL 1 MG TABLET: 1 | 30 days supply | Qty: 30 | Fill #1

## 2017-02-23 DIAGNOSIS — E2839 Other primary ovarian failure: Secondary | ICD-10-CM | POA: Diagnosis not present

## 2017-02-23 DIAGNOSIS — N959 Unspecified menopausal and perimenopausal disorder: Secondary | ICD-10-CM | POA: Diagnosis not present

## 2017-03-02 DIAGNOSIS — E119 Type 2 diabetes mellitus without complications: Secondary | ICD-10-CM | POA: Diagnosis not present

## 2017-03-02 DIAGNOSIS — M959 Acquired deformity of musculoskeletal system, unspecified: Secondary | ICD-10-CM | POA: Diagnosis not present

## 2017-03-12 MED FILL — ESTRADIOL 1 MG TABLET: 1 | 30 days supply | Qty: 30 | Fill #2

## 2017-03-12 MED FILL — PROGESTERONE 100 MG CAPSULE: 100 | 30 days supply | Qty: 30 | Fill #2

## 2017-04-07 MED FILL — ESTRADIOL 1 MG TABLET: 1 | 30 days supply | Qty: 30 | Fill #3

## 2017-04-07 MED FILL — PROGESTERONE 100 MG CAPSULE: 100 | 30 days supply | Qty: 30 | Fill #3

## 2017-04-07 MED FILL — ACCU-CHEK GUIDE TEST STRIP: 90 days supply | Qty: 100 | Fill #1

## 2017-04-30 ENCOUNTER — Ambulatory Visit (HOSPITAL_COMMUNITY)
Admission: RE | Admit: 2017-04-30 | Discharge: 2017-04-30 | Disposition: A | Discharge status: Home / Self Care | Payer: 59 | Source: Ambulatory Visit | Attending: Internal Medicine | Admitting: Internal Medicine

## 2017-04-30 ENCOUNTER — Other Ambulatory Visit (HOSPITAL_COMMUNITY): Payer: Self-pay | Admitting: Internal Medicine

## 2017-04-30 ENCOUNTER — Encounter (HOSPITAL_COMMUNITY): Payer: Self-pay

## 2017-04-30 DIAGNOSIS — M2010 Hallux valgus (acquired), unspecified foot: Secondary | ICD-10-CM | POA: Diagnosis not present

## 2017-04-30 DIAGNOSIS — E119 Type 2 diabetes mellitus without complications: Secondary | ICD-10-CM | POA: Diagnosis not present

## 2017-04-30 DIAGNOSIS — Z1231 Encounter for screening mammogram for malignant neoplasm of breast: Secondary | ICD-10-CM | POA: Diagnosis not present

## 2017-05-05 DIAGNOSIS — E119 Type 2 diabetes mellitus without complications: Secondary | ICD-10-CM | POA: Diagnosis not present

## 2017-05-05 DIAGNOSIS — E559 Vitamin D deficiency, unspecified: Secondary | ICD-10-CM | POA: Diagnosis not present

## 2017-05-05 DIAGNOSIS — R5383 Other fatigue: Secondary | ICD-10-CM | POA: Diagnosis not present

## 2017-05-05 DIAGNOSIS — E039 Hypothyroidism, unspecified: Secondary | ICD-10-CM | POA: Diagnosis not present

## 2017-05-05 DIAGNOSIS — Z0000 Encounter for general adult medical examination without abnormal findings: Secondary | ICD-10-CM | POA: Diagnosis not present

## 2017-05-05 DIAGNOSIS — N959 Unspecified menopausal and perimenopausal disorder: Secondary | ICD-10-CM | POA: Diagnosis not present

## 2017-05-05 DIAGNOSIS — Z0389 Encounter for observation for other suspected diseases and conditions ruled out: Secondary | ICD-10-CM | POA: Diagnosis not present

## 2017-05-07 MED FILL — PROGESTERONE 100 MG CAPSULE: 100 | 90 days supply | Qty: 90 | Fill #0

## 2017-05-07 MED FILL — ESTRADIOL 1 MG TABLET: 1 | 90 days supply | Qty: 90 | Fill #0

## 2017-05-20 MED FILL — LEVOTHYROXINE 150 MCG TAB: 150 | 90 days supply | Qty: 90 | Fill #1

## 2017-06-02 DIAGNOSIS — E039 Hypothyroidism, unspecified: Secondary | ICD-10-CM | POA: Diagnosis not present

## 2017-06-02 DIAGNOSIS — E669 Obesity, unspecified: Secondary | ICD-10-CM | POA: Diagnosis not present

## 2017-06-02 DIAGNOSIS — N959 Unspecified menopausal and perimenopausal disorder: Secondary | ICD-10-CM | POA: Diagnosis not present

## 2017-06-02 DIAGNOSIS — R6882 Decreased libido: Secondary | ICD-10-CM | POA: Diagnosis not present

## 2017-06-02 MED FILL — ARMOUR THYROID 60 MG TABLET: 60 | 30 days supply | Qty: 30 | Fill #0

## 2017-06-09 MED FILL — metFORMIN HCL 500 MG TABS: 500 | 90 days supply | Qty: 180 | Fill #1

## 2017-06-10 DIAGNOSIS — N959 Unspecified menopausal and perimenopausal disorder: Secondary | ICD-10-CM | POA: Diagnosis not present

## 2017-06-10 DIAGNOSIS — R5383 Other fatigue: Secondary | ICD-10-CM | POA: Diagnosis not present

## 2017-06-16 DIAGNOSIS — R5383 Other fatigue: Secondary | ICD-10-CM | POA: Diagnosis not present

## 2017-06-16 DIAGNOSIS — E039 Hypothyroidism, unspecified: Secondary | ICD-10-CM | POA: Diagnosis not present

## 2017-06-17 MED FILL — ARMOUR THYROID 90 MG TABLET: 90 | 30 days supply | Qty: 30 | Fill #0

## 2017-07-06 MED FILL — PROGESTERONE 200 MG CAPSULE: 200 | 30 days supply | Qty: 30 | Fill #0

## 2017-07-15 DIAGNOSIS — E039 Hypothyroidism, unspecified: Secondary | ICD-10-CM | POA: Diagnosis not present

## 2017-07-16 MED FILL — ARMOUR THYROID 60 MG TABLET: 60 | 30 days supply | Qty: 60 | Fill #0

## 2017-07-30 DIAGNOSIS — M542 Cervicalgia: Secondary | ICD-10-CM | POA: Diagnosis not present

## 2017-07-30 DIAGNOSIS — M62830 Muscle spasm of back: Secondary | ICD-10-CM | POA: Diagnosis not present

## 2017-07-30 DIAGNOSIS — M9901 Segmental and somatic dysfunction of cervical region: Secondary | ICD-10-CM | POA: Diagnosis not present

## 2017-07-30 DIAGNOSIS — M9902 Segmental and somatic dysfunction of thoracic region: Secondary | ICD-10-CM | POA: Diagnosis not present

## 2017-08-03 DIAGNOSIS — M9901 Segmental and somatic dysfunction of cervical region: Secondary | ICD-10-CM | POA: Diagnosis not present

## 2017-08-03 DIAGNOSIS — M62830 Muscle spasm of back: Secondary | ICD-10-CM | POA: Diagnosis not present

## 2017-08-03 DIAGNOSIS — M9902 Segmental and somatic dysfunction of thoracic region: Secondary | ICD-10-CM | POA: Diagnosis not present

## 2017-08-03 DIAGNOSIS — M542 Cervicalgia: Secondary | ICD-10-CM | POA: Diagnosis not present

## 2017-08-03 MED FILL — PROGESTERONE 200 MG CAPSULE: 200 | 90 days supply | Qty: 90 | Fill #1

## 2017-08-03 MED FILL — ESTRADIOL 1 MG TABLET: 1 | 90 days supply | Qty: 90 | Fill #1

## 2017-08-11 MED FILL — ARMOUR THYROID 60 MG TABLET: 60 | 30 days supply | Qty: 60 | Fill #1

## 2017-08-25 DIAGNOSIS — D1801 Hemangioma of skin and subcutaneous tissue: Secondary | ICD-10-CM | POA: Diagnosis not present

## 2017-08-25 DIAGNOSIS — D225 Melanocytic nevi of trunk: Secondary | ICD-10-CM | POA: Diagnosis not present

## 2017-08-25 DIAGNOSIS — D2272 Melanocytic nevi of left lower limb, including hip: Secondary | ICD-10-CM | POA: Diagnosis not present

## 2017-08-25 DIAGNOSIS — D2261 Melanocytic nevi of right upper limb, including shoulder: Secondary | ICD-10-CM | POA: Diagnosis not present

## 2017-08-25 DIAGNOSIS — D2372 Other benign neoplasm of skin of left lower limb, including hip: Secondary | ICD-10-CM | POA: Diagnosis not present

## 2017-08-25 DIAGNOSIS — L814 Other melanin hyperpigmentation: Secondary | ICD-10-CM | POA: Diagnosis not present

## 2017-08-25 DIAGNOSIS — L821 Other seborrheic keratosis: Secondary | ICD-10-CM | POA: Diagnosis not present

## 2017-08-25 DIAGNOSIS — D2262 Melanocytic nevi of left upper limb, including shoulder: Secondary | ICD-10-CM | POA: Diagnosis not present

## 2017-08-31 ENCOUNTER — Telehealth: Payer: 59 | Admitting: Family

## 2017-08-31 DIAGNOSIS — B9689 Other specified bacterial agents as the cause of diseases classified elsewhere: Secondary | ICD-10-CM | POA: Diagnosis not present

## 2017-08-31 DIAGNOSIS — J028 Acute pharyngitis due to other specified organisms: Secondary | ICD-10-CM

## 2017-08-31 MED ORDER — AZITHROMYCIN 250 MG PO TABS: ORAL_TABLET | ORAL | 0 refills | Status: DC

## 2017-08-31 MED ORDER — PREDNISONE 5 MG PO TABS: 5.0000 mg | ORAL_TABLET | Freq: Every day | ORAL | 0 refills | Status: DC

## 2017-08-31 MED ORDER — BENZONATATE 100 MG PO CAPS: 100.0000 mg | ORAL_CAPSULE | Freq: Three times a day (TID) | ORAL | 0 refills | Status: DC | PRN

## 2017-08-31 MED FILL — BENZONATATE 100 MG CAPS: 100 | 5 days supply | Qty: 30 | Fill #0

## 2017-08-31 MED FILL — AZITHROMYCIN 250 MG TABLET: 250 | 5 days supply | Qty: 6 | Fill #0

## 2017-08-31 MED FILL — predniSONE 5 MG TABS: 5 | 5 days supply | Qty: 5 | Fill #0

## 2017-08-31 NOTE — Progress Notes (Signed)
Thank you for the details you included in the comment boxes. Those details are very helpful in determining the best course of treatment for you and help [place] to provide the best care.  We are sorry that you are not feeling well.  Here is how we plan to help!  Based on your presentation I believe you most likely have A cough due to bacteria.  When patients have a fever and a productive cough with a change in color or increased sputum production, we are concerned about bacterial bronchitis.  If left untreated it can progress to pneumonia.  If your symptoms do not improve with your treatment plan it is important that you contact your provider.   I have prescribed Azithromyin 250 mg: two tablets now and then one tablet daily for 4 additonal days    In addition you may use A non-prescription cough medication called Mucinex DM: take 2 tablets every 12 hours. and A prescription cough medication called Tessalon Perles 100mg . You may take 1-2 capsules every 8 hours as needed for your cough.  Prednisone 5mg  daily for 5 days (lower dose due to your diabetes, but this will help)  From your responses in the eVisit questionnaire you describe inflammation in the upper respiratory tract which is causing a significant cough.  This is commonly called Bronchitis and has four common causes:    Allergies  Viral Infections  Acid Reflux  Bacterial Infection Allergies, viruses and acid reflux are treated by controlling symptoms or eliminating the cause. An example might be a cough caused by taking certain blood pressure medications. You stop the cough by changing the medication. Another example might be a cough caused by acid reflux. Controlling the reflux helps control the cough.  USE OF BRONCHODILATOR ("RESCUE") INHALERS: There is a risk from using your bronchodilator too frequently.  The risk is that over-reliance on a medication which only relaxes the muscles surrounding the breathing tubes can reduce the  effectiveness of medications prescribed to reduce swelling and congestion of the tubes themselves.  Although you feel brief relief from the bronchodilator inhaler, your asthma may actually be worsening with the tubes becoming more swollen and filled with mucus.  This can delay other crucial treatments, such as oral steroid medications. If you need to use a bronchodilator inhaler daily, several times per day, you should discuss this with your provider.  There are probably better treatments that could be used to keep your asthma under control.     HOME CARE . Only take medications as instructed by your medical team. . Complete the entire course of an antibiotic. . Drink plenty of fluids and get plenty of rest. . Avoid close contacts especially the very young and the elderly . Cover your mouth if you cough or cough into your sleeve. . Always remember to wash your hands . A steam or ultrasonic humidifier can help congestion.   GET HELP RIGHT AWAY IF: . You develop worsening fever. . You become short of breath . You cough up blood. . Your symptoms persist after you have completed your treatment plan MAKE SURE YOU   Understand these instructions.  Will watch your condition.  Will get help right away if you are not doing well or get worse.  Your e-visit answers were reviewed by a board certified advanced clinical practitioner to complete your personal care plan.  Depending on the condition, your plan could have included both over the counter or prescription medications. If there is a problem please  reply  once you have received a response from your provider. Your safety is important to [place].  If you have drug allergies check your prescription carefully.    You can use MyChart to ask questions about today's visit, request a non-urgent call back, or ask for a work or school excuse for 24 hours related to this e-Visit. If it has been greater than 24 hours you will need to follow up with your  provider, or enter a new e-Visit to address those concerns. You will get an e-mail in the next two days asking about your experience.  I hope that your e-visit has been valuable and will speed your recovery. Thank you for using e-visits.

## 2017-09-08 DIAGNOSIS — E039 Hypothyroidism, unspecified: Secondary | ICD-10-CM | POA: Diagnosis not present

## 2017-09-22 DIAGNOSIS — E039 Hypothyroidism, unspecified: Secondary | ICD-10-CM | POA: Diagnosis not present

## 2017-09-22 DIAGNOSIS — E119 Type 2 diabetes mellitus without complications: Secondary | ICD-10-CM | POA: Diagnosis not present

## 2017-09-22 DIAGNOSIS — R5383 Other fatigue: Secondary | ICD-10-CM | POA: Diagnosis not present

## 2017-09-22 MED FILL — METFORMIN HCL ER 500 MG TAB: 500 | 90 days supply | Qty: 180 | Fill #0

## 2017-09-22 MED FILL — ARMOUR THYROID 60 MG TABLET: 60 | 30 days supply | Qty: 60 | Fill #0

## 2017-09-22 MED FILL — ACCU-CHEK GUIDE STRP: 90 days supply | Qty: 100 | Fill #0

## 2017-09-22 MED FILL — ARMOUR THYROID 30 MG TABLET: 30 | 30 days supply | Qty: 60 | Fill #0

## 2017-09-23 DIAGNOSIS — Z23 Encounter for immunization: Secondary | ICD-10-CM | POA: Diagnosis not present

## 2017-11-04 DIAGNOSIS — E039 Hypothyroidism, unspecified: Secondary | ICD-10-CM | POA: Diagnosis not present

## 2017-11-04 DIAGNOSIS — N959 Unspecified menopausal and perimenopausal disorder: Secondary | ICD-10-CM | POA: Diagnosis not present

## 2017-11-04 DIAGNOSIS — R5383 Other fatigue: Secondary | ICD-10-CM | POA: Diagnosis not present

## 2017-11-04 DIAGNOSIS — E669 Obesity, unspecified: Secondary | ICD-10-CM | POA: Diagnosis not present

## 2017-11-04 DIAGNOSIS — E2839 Other primary ovarian failure: Secondary | ICD-10-CM | POA: Diagnosis not present

## 2017-11-04 DIAGNOSIS — E119 Type 2 diabetes mellitus without complications: Secondary | ICD-10-CM | POA: Diagnosis not present

## 2017-11-10 MED FILL — PROGESTERONE MICRONIZED 200: 200 | 90 days supply | Qty: 90 | Fill #0

## 2017-11-12 MED FILL — ESTRADIOL 1 MG TABLET: 1 | 90 days supply | Qty: 90 | Fill #0

## 2017-12-15 DIAGNOSIS — S0501XA Injury of conjunctiva and corneal abrasion without foreign body, right eye, initial encounter: Secondary | ICD-10-CM | POA: Diagnosis not present

## 2017-12-17 MED FILL — METFORMIN HCL ER 500 MG TAB: 500 | 30 days supply | Qty: 60 | Fill #1

## 2018-01-19 MED FILL — METFORMIN HCL ER 500 MG TAB: 500 | 90 days supply | Qty: 180 | Fill #0

## 2018-02-04 DIAGNOSIS — E039 Hypothyroidism, unspecified: Secondary | ICD-10-CM | POA: Diagnosis not present

## 2018-02-04 DIAGNOSIS — N959 Unspecified menopausal and perimenopausal disorder: Secondary | ICD-10-CM | POA: Diagnosis not present

## 2018-02-04 DIAGNOSIS — E119 Type 2 diabetes mellitus without complications: Secondary | ICD-10-CM | POA: Diagnosis not present

## 2018-02-04 MED FILL — ESTRADIOL 1 MG TABLET: 1 | 90 days supply | Qty: 90 | Fill #0

## 2018-02-04 MED FILL — NP THYROID 90 MG TABLET: 90 | 90 days supply | Qty: 180 | Fill #0

## 2018-02-04 MED FILL — PROGESTERONE MICRONIZED 200: 200 | 90 days supply | Qty: 90 | Fill #0

## 2018-02-24 ENCOUNTER — Ambulatory Visit (INDEPENDENT_AMBULATORY_CARE_PROVIDER_SITE_OTHER): Payer: 59 | Admitting: Adult Health

## 2018-02-24 ENCOUNTER — Encounter: Payer: Self-pay | Admitting: Adult Health

## 2018-02-24 VITALS — BP 104/61 | HR 55 | Ht 67.0 in | Wt 151.5 lb

## 2018-02-24 DIAGNOSIS — Z01419 Encounter for gynecological examination (general) (routine) without abnormal findings: Secondary | ICD-10-CM | POA: Insufficient documentation

## 2018-02-24 DIAGNOSIS — Z1212 Encounter for screening for malignant neoplasm of rectum: Secondary | ICD-10-CM | POA: Diagnosis not present

## 2018-02-24 DIAGNOSIS — Z1211 Encounter for screening for malignant neoplasm of colon: Secondary | ICD-10-CM | POA: Diagnosis not present

## 2018-02-24 DIAGNOSIS — N950 Postmenopausal bleeding: Secondary | ICD-10-CM

## 2018-02-24 DIAGNOSIS — Z79890 Hormone replacement therapy (postmenopausal): Secondary | ICD-10-CM | POA: Diagnosis not present

## 2018-02-24 LAB — HEMOCCULT GUIAC POC 1CARD (OFFICE): Fecal Occult Blood, POC: NEGATIVE

## 2018-02-24 NOTE — Patient Instructions (Signed)
Postmenopausal Bleeding Postmenopausal bleeding is any bleeding after menopause. Menopause is when a woman's period stops. Any type of bleeding after menopause is concerning. It should be checked by your doctor. Any treatment will depend on the cause. Follow these instructions at home: Watch your condition for any changes.  Avoid the use of tampons and douches as told by your doctor.  Change your pads often.  Get regular pelvic exams and Pap tests.  Keep all appointments for tests as told by your doctor.  Contact a doctor if:  Your bleeding lasts for more than 1 week.  You have belly (abdominal) pain.  You have bleeding after sex (intercourse). Get help right away if:  You have a fever, chills, a headache, dizziness, muscle aches, and bleeding.  You have strong pain with bleeding.  You have clumps of blood (blood clots) coming from your vagina.  You have bleeding and need more than 1 pad an hour.  You feel like you are going to pass out (faint). This information is not intended to replace advice given to you by your health care provider. Make sure you discuss any questions you have with your health care provider. Document Released: 02/12/2008 Document Revised: 10/11/2015 Document Reviewed: 12/02/2012 Elsevier Interactive Patient Education  2017 [employer]  

## 2018-02-24 NOTE — Progress Notes (Signed)
Patient ID: Jenna Carroll, female   DOB: Jan 24, 1961, 57 y.o.   MRN: [id number] History of Present Illness: Jenna Carroll is a 57 year old white female, married, in for a well woman gyn exam,she had a normal pap with negative HPV 04/08/16.She has had back pain this summer and 1 episode of vaginal spotting, and is on HRT. She is a Immunologist at [employer].  PCP is Dr Anastasio Champion.   Current Medications, Allergies, Past Medical History, Past Surgical History, Family History and Social History were reviewed in [employer] record.     Review of Systems: Patient denies any headaches, hearing loss, fatigue, blurred vision, shortness of breath, chest pain, abdominal pain, problems with bowel movements, urination, or intercourse. No joint pain or mood swings. +back pain this summer, and has had an episode of spotting, she is on HRT    Physical Exam:BP 104/61 (BP Location: Left Arm, Patient Position: Sitting, Cuff Size: Normal)   Pulse (!) 55   Ht 5\' 7"  (1.702 m)   Wt 151 lb 8 oz (68.7 kg)   LMP 11/07/2015   BMI 23.73 kg/m She has lost about 40 lbs in last 2 years after being diagnosed with diabetes.  General:  Well developed, well nourished, no acute distress Skin:  Warm and dry Neck:  Midline trachea, normal thyroid, good ROM, no lymphadenopathy Lungs; Clear to auscultation bilaterally Breast:  No dominant palpable mass, retraction, or nipple discharge Cardiovascular: Regular rate and rhythm Abdomen:  Soft, non tender, no hepatosplenomegaly Pelvic:  External genitalia is normal in appearance, no lesions.  The vagina is pale with loss of rugae, and moisture is fair.Marland Kitchen Urethra has no lesions or masses. The cervix has nabothian cyst at 3 o'clock.  Uterus is felt to be normal size, shape, and contour.  No adnexal masses or tenderness noted.Bladder is non tender, no masses felt. Rectal: Good sphincter tone, no polyps, or hemorrhoids felt.  Hemoccult negative. Extremities/musculoskeletal:  No  swelling, + varicosities noted,L>R, no clubbing or cyanosis Psych:  No mood changes, alert and cooperative,seems happy PHQ 2 score 0. Pt gave verbal consent for exam without chaperon.   Impression: 1. Encounter for well woman exam with routine gynecological exam   2. Screening for colorectal cancer   3. PMB (postmenopausal bleeding)   4. Hormone replacement therapy (HRT)       Plan: GYN [place] 10/14 at 4 pm to assess uterus and ovaries Pap and physical in 1 year Mammogram yearly Labs with PCP Colonoscopy per GI

## 2018-03-01 ENCOUNTER — Ambulatory Visit (INDEPENDENT_AMBULATORY_CARE_PROVIDER_SITE_OTHER): Payer: 59

## 2018-03-01 DIAGNOSIS — N950 Postmenopausal bleeding: Secondary | ICD-10-CM

## 2018-03-01 NOTE — Progress Notes (Signed)
PELVIC US TA/TV: heterogeneous anteverted uterus,no measurable fibroids visualized,EEC 2 mm,normal right ovary,simple left ovarian cyst 1 x .7 x .8 cm,no free fluid,no pain during ultrasound

## 2018-03-02 ENCOUNTER — Telehealth: Payer: Self-pay | Admitting: Adult Health

## 2018-03-02 NOTE — Telephone Encounter (Signed)
Pt aware that US shows normal uterus, EEC 2 mm and has simple 1 x .7 x.8 cm cyst left ovary, will repeat US in 1 year for stability, is on HRT from PCP, and can continue

## 2018-04-23 MED FILL — metFORMIN HCL ER 500 MG TB2: 500 | 90 days supply | Qty: 180 | Fill #1

## 2018-04-30 ENCOUNTER — Telehealth: Payer: 59 | Admitting: Family

## 2018-04-30 DIAGNOSIS — R059 Cough, unspecified: Secondary | ICD-10-CM

## 2018-04-30 DIAGNOSIS — R05 Cough: Secondary | ICD-10-CM

## 2018-04-30 MED ORDER — BENZONATATE 200 MG PO CAPS: 200.0000 mg | ORAL_CAPSULE | Freq: Two times a day (BID) | ORAL | 0 refills | Status: DC | PRN

## 2018-04-30 MED ORDER — PREDNISONE 10 MG (21) PO TBPK: ORAL_TABLET | ORAL | 0 refills | Status: DC

## 2018-04-30 NOTE — Progress Notes (Signed)

## 2018-05-04 DIAGNOSIS — E119 Type 2 diabetes mellitus without complications: Secondary | ICD-10-CM | POA: Diagnosis not present

## 2018-05-10 DIAGNOSIS — E039 Hypothyroidism, unspecified: Secondary | ICD-10-CM | POA: Diagnosis not present

## 2018-05-10 DIAGNOSIS — E559 Vitamin D deficiency, unspecified: Secondary | ICD-10-CM | POA: Diagnosis not present

## 2018-05-10 DIAGNOSIS — E2839 Other primary ovarian failure: Secondary | ICD-10-CM | POA: Diagnosis not present

## 2018-05-10 DIAGNOSIS — R5383 Other fatigue: Secondary | ICD-10-CM | POA: Diagnosis not present

## 2018-05-10 DIAGNOSIS — N959 Unspecified menopausal and perimenopausal disorder: Secondary | ICD-10-CM | POA: Diagnosis not present

## 2018-05-10 DIAGNOSIS — Z0000 Encounter for general adult medical examination without abnormal findings: Secondary | ICD-10-CM | POA: Diagnosis not present

## 2018-05-10 DIAGNOSIS — E119 Type 2 diabetes mellitus without complications: Secondary | ICD-10-CM | POA: Diagnosis not present

## 2018-05-10 DIAGNOSIS — E785 Hyperlipidemia, unspecified: Secondary | ICD-10-CM | POA: Diagnosis not present

## 2018-05-10 MED FILL — ESTRADIOL 1 MG TABLET: 1 | 90 days supply | Qty: 90 | Fill #0

## 2018-05-10 MED FILL — PROGESTERONE MICRONIZED 200: 200 | 90 days supply | Qty: 90 | Fill #0

## 2018-06-24 ENCOUNTER — Other Ambulatory Visit (HOSPITAL_COMMUNITY): Payer: Self-pay | Admitting: Family Medicine

## 2018-06-24 ENCOUNTER — Other Ambulatory Visit (HOSPITAL_COMMUNITY): Payer: Self-pay | Admitting: Internal Medicine

## 2018-06-24 ENCOUNTER — Ambulatory Visit (HOSPITAL_COMMUNITY)
Admission: RE | Admit: 2018-06-24 | Discharge: 2018-06-24 | Disposition: A | Discharge status: Home / Self Care | Payer: 59 | Source: Ambulatory Visit | Attending: Internal Medicine | Admitting: Internal Medicine

## 2018-06-24 DIAGNOSIS — Z1231 Encounter for screening mammogram for malignant neoplasm of breast: Secondary | ICD-10-CM | POA: Insufficient documentation

## 2018-06-30 MED FILL — ESTRADIOL 1 MG TABLET: 1 | 90 days supply | Qty: 135 | Fill #0

## 2018-07-22 MED FILL — metFORMIN HCL ER 500 MG TB2: 500 | 90 days supply | Qty: 180 | Fill #0

## 2018-07-22 MED FILL — NP THYROID 90 MG TABLET: 90 | 90 days supply | Qty: 180 | Fill #1

## 2018-08-03 MED FILL — PROGESTERONE MICRONIZED 200: 200 | 90 days supply | Qty: 90 | Fill #1

## 2018-08-10 ENCOUNTER — Encounter (INDEPENDENT_AMBULATORY_CARE_PROVIDER_SITE_OTHER): Payer: Self-pay | Admitting: Internal Medicine

## 2018-08-12 ENCOUNTER — Encounter (INDEPENDENT_AMBULATORY_CARE_PROVIDER_SITE_OTHER): Payer: Self-pay | Admitting: Internal Medicine

## 2018-08-16 DIAGNOSIS — D2271 Melanocytic nevi of right lower limb, including hip: Secondary | ICD-10-CM | POA: Diagnosis not present

## 2018-08-16 DIAGNOSIS — D2261 Melanocytic nevi of right upper limb, including shoulder: Secondary | ICD-10-CM | POA: Diagnosis not present

## 2018-08-16 DIAGNOSIS — D1801 Hemangioma of skin and subcutaneous tissue: Secondary | ICD-10-CM | POA: Diagnosis not present

## 2018-08-16 DIAGNOSIS — D2262 Melanocytic nevi of left upper limb, including shoulder: Secondary | ICD-10-CM | POA: Diagnosis not present

## 2018-08-16 DIAGNOSIS — L570 Actinic keratosis: Secondary | ICD-10-CM | POA: Diagnosis not present

## 2018-08-16 DIAGNOSIS — D225 Melanocytic nevi of trunk: Secondary | ICD-10-CM | POA: Diagnosis not present

## 2018-08-16 DIAGNOSIS — D2372 Other benign neoplasm of skin of left lower limb, including hip: Secondary | ICD-10-CM | POA: Diagnosis not present

## 2018-08-16 DIAGNOSIS — L821 Other seborrheic keratosis: Secondary | ICD-10-CM | POA: Diagnosis not present

## 2018-09-30 DIAGNOSIS — R5383 Other fatigue: Secondary | ICD-10-CM | POA: Diagnosis not present

## 2018-09-30 DIAGNOSIS — E559 Vitamin D deficiency, unspecified: Secondary | ICD-10-CM | POA: Diagnosis not present

## 2018-09-30 DIAGNOSIS — E119 Type 2 diabetes mellitus without complications: Secondary | ICD-10-CM | POA: Diagnosis not present

## 2018-09-30 DIAGNOSIS — N959 Unspecified menopausal and perimenopausal disorder: Secondary | ICD-10-CM | POA: Diagnosis not present

## 2018-09-30 DIAGNOSIS — E039 Hypothyroidism, unspecified: Secondary | ICD-10-CM | POA: Diagnosis not present

## 2018-09-30 MED FILL — PROGESTERONE 200 MG CAPSULE: 200 | 90 days supply | Qty: 90 | Fill #0

## 2018-09-30 MED FILL — NP THYROID 90 MG TABLET: 90 | 90 days supply | Qty: 180 | Fill #0

## 2018-09-30 MED FILL — metFORMIN HCL ER 500 MG TB2: 500 | 90 days supply | Qty: 180 | Fill #0

## 2018-09-30 MED FILL — ESTRADIOL 1 MG TABS: 1 | 90 days supply | Qty: 135 | Fill #0

## 2018-10-05 DIAGNOSIS — E559 Vitamin D deficiency, unspecified: Secondary | ICD-10-CM | POA: Diagnosis not present

## 2018-10-05 DIAGNOSIS — N959 Unspecified menopausal and perimenopausal disorder: Secondary | ICD-10-CM | POA: Diagnosis not present

## 2018-10-05 DIAGNOSIS — E119 Type 2 diabetes mellitus without complications: Secondary | ICD-10-CM | POA: Diagnosis not present

## 2019-01-02 MED FILL — metFORMIN HCL ER 500 MG TB2: 500 | 90 days supply | Qty: 180 | Fill #1

## 2019-01-02 MED FILL — PROGESTERONE 200 MG CAPSULE: 200 | 90 days supply | Qty: 90 | Fill #1

## 2019-01-03 MED FILL — NP THYROID 90 MG TABLET: 90 | 90 days supply | Qty: 180 | Fill #1

## 2019-01-03 MED FILL — ESTRADIOL 1 MG TABS: 1 | 90 days supply | Qty: 135 | Fill #1

## 2019-02-08 ENCOUNTER — Other Ambulatory Visit: Payer: Self-pay

## 2019-02-08 ENCOUNTER — Other Ambulatory Visit (INDEPENDENT_AMBULATORY_CARE_PROVIDER_SITE_OTHER): Payer: 59

## 2019-02-08 ENCOUNTER — Other Ambulatory Visit (INDEPENDENT_AMBULATORY_CARE_PROVIDER_SITE_OTHER): Payer: Self-pay | Admitting: Internal Medicine

## 2019-02-08 DIAGNOSIS — E559 Vitamin D deficiency, unspecified: Secondary | ICD-10-CM | POA: Diagnosis not present

## 2019-02-08 DIAGNOSIS — E119 Type 2 diabetes mellitus without complications: Secondary | ICD-10-CM | POA: Diagnosis not present

## 2019-02-09 LAB — COMPLETE METABOLIC PANEL WITH GFR
AG Ratio: 1.8 (calc) (ref 1.0–2.5)
ALT: 22 U/L (ref 6–29)
AST: 21 U/L (ref 10–35)
Albumin: 4.4 g/dL (ref 3.6–5.1)
Alkaline phosphatase (APISO): 48 U/L (ref 37–153)
BUN: 18 mg/dL (ref 7–25)
CO2: 24 mmol/L (ref 20–32)
Calcium: 9.5 mg/dL (ref 8.6–10.4)
Chloride: 104 mmol/L (ref 98–110)
Creat: 0.79 mg/dL (ref 0.50–1.05)
GFR, Est African American: 96 mL/min/{1.73_m2} (ref >=60)
GFR, Est Non African American: 83 mL/min/{1.73_m2} (ref >=60)
Globulin: 2.4 g/dL (calc) (ref 1.9–3.7)
Glucose, Bld: 106 mg/dL — ABNORMAL HIGH (ref 65–99)
Potassium: 4.1 mmol/L (ref 3.5–5.3)
Sodium: 138 mmol/L (ref 135–146)
Total Bilirubin: 1.5 mg/dL — ABNORMAL HIGH (ref 0.2–1.2)
Total Protein: 6.8 g/dL (ref 6.1–8.1)

## 2019-02-09 LAB — VITAMIN D 25 HYDROXY (VIT D DEFICIENCY, FRACTURES): Vit D, 25-Hydroxy: 97 ng/mL (ref 30–100)

## 2019-02-09 LAB — HEMOGLOBIN A1C
Hgb A1c MFr Bld: 6.8 % of total Hgb — ABNORMAL HIGH (ref <5.7)
Mean Plasma Glucose: 148 (calc)
eAG (mmol/L): 8.2 (calc)

## 2019-02-09 NOTE — Progress Notes (Signed)
Please see your results.  Make an appointment when convenient so we can discuss fully.

## 2019-04-19 ENCOUNTER — Telehealth (INDEPENDENT_AMBULATORY_CARE_PROVIDER_SITE_OTHER): Payer: Self-pay | Admitting: Internal Medicine

## 2019-04-19 ENCOUNTER — Other Ambulatory Visit (INDEPENDENT_AMBULATORY_CARE_PROVIDER_SITE_OTHER): Payer: Self-pay | Admitting: Internal Medicine

## 2019-04-19 DIAGNOSIS — E119 Type 2 diabetes mellitus without complications: Secondary | ICD-10-CM | POA: Diagnosis not present

## 2019-04-19 MED ORDER — NP THYROID 90 MG PO TABS: 90.0000 mg | ORAL_TABLET | Freq: Two times a day (BID) | ORAL | 0 refills | Status: DC

## 2019-04-19 MED ORDER — ESTRADIOL 1 MG PO TABS: 1.5000 mg | ORAL_TABLET | Freq: Every day | ORAL | 0 refills | Status: DC

## 2019-04-19 MED ORDER — METFORMIN HCL 500 MG PO TABS: 500.0000 mg | ORAL_TABLET | Freq: Two times a day (BID) | ORAL | 0 refills | Status: DC

## 2019-04-19 MED ORDER — PROGESTERONE MICRONIZED 200 MG PO CAPS: 200.0000 mg | ORAL_CAPSULE | Freq: Every day | ORAL | 0 refills | Status: DC

## 2019-04-19 MED FILL — PROGESTERONE 200 MG CAPSULE: 200 | 90 days supply | Qty: 90 | Fill #0

## 2019-04-19 MED FILL — ESTRADIOL 1 MG TABS: 1 | 90 days supply | Qty: 135 | Fill #0

## 2019-04-19 MED FILL — NP THYROID 90 MG TABLET: 90 | 90 days supply | Qty: 180 | Fill #0

## 2019-04-19 MED FILL — metFORMIN HCL 500 MG TABS: 500 | 90 days supply | Qty: 180 | Fill #0

## 2019-04-19 NOTE — Telephone Encounter (Signed)
Done

## 2019-05-18 ENCOUNTER — Other Ambulatory Visit: Payer: Self-pay

## 2019-05-18 ENCOUNTER — Ambulatory Visit (INDEPENDENT_AMBULATORY_CARE_PROVIDER_SITE_OTHER): Payer: 59 | Admitting: Internal Medicine

## 2019-05-18 ENCOUNTER — Encounter (INDEPENDENT_AMBULATORY_CARE_PROVIDER_SITE_OTHER): Payer: Self-pay | Admitting: Internal Medicine

## 2019-05-18 VITALS — BP 120/68 | HR 68 | Ht 67.0 in | Wt 157.0 lb

## 2019-05-18 DIAGNOSIS — Z0001 Encounter for general adult medical examination with abnormal findings: Secondary | ICD-10-CM | POA: Diagnosis not present

## 2019-05-18 DIAGNOSIS — E2839 Other primary ovarian failure: Secondary | ICD-10-CM

## 2019-05-18 DIAGNOSIS — E119 Type 2 diabetes mellitus without complications: Secondary | ICD-10-CM

## 2019-05-18 DIAGNOSIS — E039 Hypothyroidism, unspecified: Secondary | ICD-10-CM | POA: Diagnosis not present

## 2019-05-18 DIAGNOSIS — E559 Vitamin D deficiency, unspecified: Secondary | ICD-10-CM | POA: Diagnosis not present

## 2019-05-18 NOTE — Patient Instructions (Signed)
[facility] Dietary Recommendations for Weight Loss What to Avoid . Avoid added sugars o Often added sugar can be found in processed foods such as many condiments, dry cereals, cakes, cookies, chips, crisps, crackers, candies, sweetened drinks, etc.  o Read labels and AVOID/DECREASE use of foods with the following in their ingredient list: Sugar, fructose, high fructose corn syrup, sucrose, glucose, maltose, dextrose, molasses, cane sugar, brown sugar, any type of syrup, agave nectar, etc.   . Avoid snacking in between meals . Avoid foods made with flour o If you are going to eat food made with flour, choose those made with whole-grains; and, minimize your consumption as much as is tolerable . Avoid processed foods o These foods are generally stocked in the middle of the grocery store. Focus on shopping on the perimeter of the grocery.  . Avoid Meat  o We recommend following a plant-based diet at [facility]. Thus, we recommend avoiding meat as a general rule. Consider eating beans, legumes, eggs, and/or dairy products for regular protein sources o If you plan on eating meat limit to 4 ounces of meat at a time and choose lean options such as Fish, chicken, turkey. Avoid red meat intake such as pork and/or steak What to Include . Vegetables o GREEN LEAFY VEGETABLES: Kale, spinach, mustard greens, collard greens, cabbage, broccoli, etc. o OTHER: Asparagus, cauliflower, eggplant, carrots, peas, Brussel sprouts, tomatoes, bell peppers, zucchini, beets, cucumbers, etc. . Grains, seeds, and legumes o Beans: kidney beans, black eyed peas, garbanzo beans, black beans, pinto beans, etc. o Whole, unrefined grains: brown rice, barley, bulgur, oatmeal, etc. . Healthy fats  o Avoid highly processed fats such as vegetable oil o Examples of healthy fats: avocado, olives, virgin olive oil, dark chocolate (?72% Cocoa), nuts (peanuts, almonds, walnuts, cashews, pecans, etc.) . None to Low  Intake of Animal Sources of Protein o Meat sources: chicken, turkey, salmon, tuna. Limit to 4 ounces of meat at one time. o Consider limiting dairy sources, but when choosing dairy focus on: PLAIN Greek yogurt, cottage cheese, high-protein milk . Fruit o Choose berries  When to Eat . Intermittent Fasting: o Choosing not to eat for a specific time period, but DO FOCUS ON HYDRATION when fasting o Multiple Techniques: - Time Restricted Eating: eat 3 meals in a day, each meal lasting no more than 60 minutes, no snacks between meals - 16-18 hour fast: fast for 16 to 18 hours up to 7 days a week. Often suggested to start with 2-3 nonconsecutive days per week.  . Remember the time you sleep is counted as fasting.  . Examples of eating schedule: Fast from 7:00pm-11:00am. Eat between 11:00am-7:00pm.  - 24-hour fast: fast for 24 hours up to every other day. Often suggested to start with 1 day per week . Remember the time you sleep is counted as fasting . Examples of eating schedule:  o Eating day: eat 2-3 meals on your eating day. If doing 2 meals, each meal should last no more than 90 minutes. If doing 3 meals, each meal should last no more than 60 minutes. Finish last meal by 7:00pm. o Fasting day: Fast until 7:00pm.  o IF YOU FEEL UNWELL FOR ANY REASON/IN ANY WAY WHEN FASTING, STOP FASTING BY EATING A NUTRITIOUS SNACK OR LIGHT MEAL o ALWAYS FOCUS ON HYDRATION DURING FASTS - Acceptable Hydration sources: water, broths, tea/coffee (black tea/coffee is best but using a small amount of whole-fat dairy products in coffee/tea is acceptable).  -   Poor Hydration Sources: anything with sugar or artificial sweeteners added to it  These recommendations have been developed for patients that are actively receiving medical care from either Dr. Legaci Tarman or Sarah Gray, DNP, NP-C at [facility]. These recommendations are developed for patients with specific medical conditions and are not meant to be  distributed or used by others that are not actively receiving care from either provider listed above at [facility]. It is not appropriate to participate in the above eating plans without proper medical supervision.   Reference: Fung, J. The obesity code. [place]: [place]; 2016.   

## 2019-05-18 NOTE — Progress Notes (Signed)
Chief Complaint: This delightful 58 year old lady comes in for an annual physical exam and to address her chronic conditions which are described below. HPI: She has type 2 diabetes and her hemoglobin A1c approximately 3 months ago was 6.8%.  In the month of December, she admits she has not been very good in terms of nutrition or exercise.  She wants to get back onto this as soon as possible.  She is controlled with Metformin alone.  She has seen the eye doctor more than a year ago and she is due to go and see 1. She denies any paresthesia in her feet. She has no coronary artery disease or cerebrovascular disease.  She denies any chest pain, dyspnea, palpitations or limb weakness. She also has hypothyroidism and she is well controlled with her symptoms now with desiccated NP thyroid twice a day. She also continues with bioidentical hormone therapy for menopausal symptoms and also including testosterone therapy.  She is tolerating all of these very well.  Past Medical History:  Diagnosis Date  . Arthritis   . DM (diabetes mellitus) [facility]) Oct 2013  . Hyperlipidemia, mild   . Thyroid disease    Past Surgical History:  Procedure Laterality Date  . left ankle    . 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    Social History   Tobacco Use  . Smoking status: Never Smoker  . Smokeless tobacco: Never Used  Substance Use Topics  . Alcohol use: Yes    Comment: occ      Allergies: No Known Allergies   Current Meds  Medication Sig  . Cholecalciferol (VITAMIN D-3) 125 MCG (5000 UT) TABS Take 2 tablets by mouth daily.  . Cyanocobalamin (VITAMIN B-12 PO) Take 5,000 mcg by mouth daily.  Marland Kitchen estradiol (ESTRACE) 1 MG tablet Take 1.5 tablets (1.5 mg total) by mouth daily.  . metFORMIN (GLUCOPHAGE) 500 MG tablet Take 1 tablet (500 mg total) by mouth 2 (two) times daily with a meal.  .  Naproxen Sodium (ALEVE PO) Take by mouth as needed.  . NP THYROID 90 MG tablet Take 1 tablet (90 mg total) by mouth 2 (two) times daily.  . progesterone (PROMETRIUM) 200 MG capsule Take 1 capsule (200 mg total) by mouth daily.  . TESTOSTERONE IM Inject into the muscle once a week.  . [DISCONTINUED] benzonatate (TESSALON) 200 MG capsule Take 1 capsule (200 mg total) by mouth 2 (two) times daily as needed for cough.  . [DISCONTINUED] Cholecalciferol (VITAMIN D PO) Take by mouth daily.  . [DISCONTINUED] predniSONE (STERAPRED UNI-PAK 21 TAB) 10 MG (21) TBPK tablet As directed  . [DISCONTINUED] UNABLE TO FIND Magneisum oil-apply topically twice a week     Nutrition Not consistent at the present time but she was previously doing intermittent fasting consistently and she wants to go back to doing this.  Sleep Adequate.  Exercise None regular at the present time but she has started to walk again.  Bio-identical hormones Testosterone therapy is being used off label for symptoms of testosterone deficiency and benefits that it produces based on several studies.  These benefits include decreasing body fat, increasing in lean muscle mass and increasing in bone density.  There is improvement of memory, cognition.  There is improvement in exercise tolerance and endurance.  Testosterone therapy has also been shown to be protective against coronary artery disease, cerebrovascular disease,  diabetes, hypertension and degenerative joint disease. I have discussed with the patient the FDA warnings regarding testosterone therapy, benefits and side effects and modes of administration as well as monitoring blood levels and side effects  on a regular basis The patient is agreeable that testosterone therapy should be an integral part of his/her wellness,quality of life and prevention of chronic disease.  Micronized progesterone is being used in this patient for multiple benefits based on studies including protection  against uterine cancer, breast cancer, osteoporosis and heart disease. The patient has been counseled regarding side effects, benefits and modes of administration. The patient is agreeable that this therapy is an integral part of her wellness, quality of life and prevention of chronic disease.  Estradiol is being used in this patient for multiple benefits based on several studies including protection against heart disease, cerebrovascular disease, osteoporosis, colon cancer, Alzheimer's disease, macular degeneration and cataracts. The patient has been counseled regarding benefits and side effects and modes of administration. The patient is agreeable that this therapy is an integral to part of her wellness, quality of life and prevention of chronic disease.  XYV:OPFYT from the symptoms mentioned above,there are no other symptoms referable to all systems reviewed.  Physical Exam: Blood pressure 120/68, pulse 68, height '5\' 7"'$  (1.702 m), weight 157 lb (71.2 kg), last menstrual period 11/07/2015, SpO2 99 %. Vitals with BMI 05/18/2019 02/24/2018 05/15/2016  Height '5\' 7"'$  '5\' 7"'$  '5\' 7"'$   Weight 157 lbs 151 lbs 8 oz 189 lbs  BMI 24.58 24.46 28.6  Systolic 381 771 165  Diastolic 68 61 67  Pulse 68 55 61      She looks systemically well.  She has gained about 5 pounds since the last time I saw her in May of this year.  Blood pressure is well controlled. General: Alert, cooperative, and appears to be the stated age.No pallor.  No jaundice.  No clubbing. Head: Normocephalic Eyes: Sclera white, pupils equal and reactive to light, red reflex x 2,  Ears: Normal bilaterally Oral cavity: Lips, mucosa, and tongue normal: Teeth and gums normal Neck: No adenopathy, supple, symmetrical, trachea midline, and thyroid does not appear enlarged. Breast: No masses felt. Respiratory: Clear to auscultation bilaterally.No wheezing, crackles or bronchial breathing. Cardiovascular: Heart sounds are present and appear to  be normal without murmurs or added sounds.  No carotid bruits.  Peripheral pulses are present and equal bilaterally.: Gastrointestinal:positive bowel sounds, no hepatosplenomegaly.  No masses felt.No tenderness. Skin: Clear, No rashes noted.No worrisome skin lesions seen. Neurological: Grossly intact without focal findings, cranial nerves II through XII intact, muscle strength equal bilaterally Musculoskeletal: No acute joint abnormalities noted.Full range of movement noted with joints. Psychiatric: Affect appropriate, non-anxious.    Assessment  1. Acquired hypothyroidism   2. Diabetes mellitus type 2, diet-controlled ([place])   3. Vitamin D deficiency   4. Encounter for general adult medical examination with abnormal findings   5. Primary ovarian failure     Tests Ordered:   Orders Placed This Encounter  Procedures  . CBC  . CMP with eGFR([facility])  . Hemoglobin A1c  . Lipid Panel  . Progesterone  . Estradiol  . T3, Free  . TSH  . Testos,Total,Free and SHBG (Female)  . Vitamin D, 25-hydroxy     Plan  1. Blood work is ordered as above. 2. She will continue with desiccated thyroid for hypothyroidism.  We will see if we need to alter any doses based on blood levels. 3. She will continue  with diabetic medications with Metformin and we will check an A1c. 4. She will continue with bioidentical hormone therapy and we will check all those levels today. 5. She also has vitamin D deficiency and we will check levels today.  She is currently taking vitamin D3 10,000 units daily. 6. Further recommendations will depend on blood results and I will see her in 3 months time for follow-up. 7. Today, in addition to a preventative visit, I performed an office visit to address all her chronic conditions above.     No orders of the defined types were placed in this encounter.    Laniesha Das C Ritaj Dullea   05/18/2019, 10:34 AM

## 2019-05-23 LAB — CBC
HCT: 38.8 % (ref 35.0–45.0)
Hemoglobin: 13.1 g/dL (ref 11.7–15.5)
MCH: 31 pg (ref 27.0–33.0)
MCHC: 33.8 g/dL (ref 32.0–36.0)
MCV: 91.7 fL (ref 80.0–100.0)
MPV: 10.9 fL (ref 7.5–12.5)
Platelets: 222 10*3/uL (ref 140–400)
RBC: 4.23 10*6/uL (ref 3.80–5.10)
RDW: 12.4 % (ref 11.0–15.0)
WBC: 5.1 10*3/uL (ref 3.8–10.8)

## 2019-05-23 LAB — LIPID PANEL
Cholesterol: 227 mg/dL — ABNORMAL HIGH (ref <200)
HDL: 114 mg/dL (ref >=50)
LDL Cholesterol (Calc): 102 mg/dL (calc) — ABNORMAL HIGH
Non-HDL Cholesterol (Calc): 113 mg/dL (calc) (ref <130)
Total CHOL/HDL Ratio: 2 (calc) (ref <5.0)
Triglycerides: 39 mg/dL (ref <150)

## 2019-05-23 LAB — COMPLETE METABOLIC PANEL WITH GFR
AG Ratio: 1.8 (calc) (ref 1.0–2.5)
ALT: 14 U/L (ref 6–29)
AST: 15 U/L (ref 10–35)
Albumin: 4.8 g/dL (ref 3.6–5.1)
Alkaline phosphatase (APISO): 51 U/L (ref 37–153)
BUN: 18 mg/dL (ref 7–25)
CO2: 27 mmol/L (ref 20–32)
Calcium: 9.8 mg/dL (ref 8.6–10.4)
Chloride: 102 mmol/L (ref 98–110)
Creat: 0.81 mg/dL (ref 0.50–1.05)
GFR, Est African American: 93 mL/min/{1.73_m2} (ref >=60)
GFR, Est Non African American: 81 mL/min/{1.73_m2} (ref >=60)
Globulin: 2.7 g/dL (calc) (ref 1.9–3.7)
Glucose, Bld: 115 mg/dL — ABNORMAL HIGH (ref 65–99)
Potassium: 3.7 mmol/L (ref 3.5–5.3)
Sodium: 138 mmol/L (ref 135–146)
Total Bilirubin: 1.2 mg/dL (ref 0.2–1.2)
Total Protein: 7.5 g/dL (ref 6.1–8.1)

## 2019-05-23 LAB — HEMOGLOBIN A1C
Hgb A1c MFr Bld: 6.6 % of total Hgb — ABNORMAL HIGH (ref <5.7)
Mean Plasma Glucose: 143 (calc)
eAG (mmol/L): 7.9 (calc)

## 2019-05-23 LAB — TESTOS,TOTAL,FREE AND SHBG (FEMALE)
Free Testosterone: 4 pg/mL (ref 0.1–6.4)
Sex Hormone Binding: 129 nmol/L — ABNORMAL HIGH (ref 14–73)
Testosterone, Total, LC-MS-MS: 63 ng/dL — ABNORMAL HIGH (ref 2–45)

## 2019-05-23 LAB — T3, FREE: T3, Free: 5.7 pg/mL — ABNORMAL HIGH (ref 2.3–4.2)

## 2019-05-23 LAB — ESTRADIOL: Estradiol: 91 pg/mL

## 2019-05-23 LAB — PROGESTERONE: Progesterone: 28.9 ng/mL

## 2019-05-23 LAB — TSH: TSH: 0.1 mIU/L — ABNORMAL LOW (ref 0.40–4.50)

## 2019-05-23 LAB — VITAMIN D 25 HYDROXY (VIT D DEFICIENCY, FRACTURES): Vit D, 25-Hydroxy: 93 ng/mL (ref 30–100)

## 2019-07-20 ENCOUNTER — Other Ambulatory Visit (INDEPENDENT_AMBULATORY_CARE_PROVIDER_SITE_OTHER): Payer: Self-pay | Admitting: Internal Medicine

## 2019-07-20 MED ORDER — PROGESTERONE MICRONIZED 200 MG PO CAPS: 200.0000 mg | ORAL_CAPSULE | Freq: Every day | ORAL | 0 refills | Status: DC

## 2019-07-20 MED ORDER — NP THYROID 90 MG PO TABS: 90.0000 mg | ORAL_TABLET | Freq: Two times a day (BID) | ORAL | 0 refills | Status: DC

## 2019-07-20 MED ORDER — ESTRADIOL 1 MG PO TABS: 1.5000 mg | ORAL_TABLET | Freq: Every day | ORAL | 0 refills | Status: DC

## 2019-07-20 MED ORDER — METFORMIN HCL 500 MG PO TABS: 500.0000 mg | ORAL_TABLET | Freq: Two times a day (BID) | ORAL | 0 refills | Status: DC

## 2019-07-20 MED FILL — ESTRADIOL 1 MG TABS: 1 | 90 days supply | Qty: 135 | Fill #0

## 2019-07-20 MED FILL — metFORMIN HCL 500 MG TABS: 500 | 90 days supply | Qty: 180 | Fill #0

## 2019-07-20 MED FILL — PROGESTERONE MICRONIZED 200: 200 | 90 days supply | Qty: 90 | Fill #0

## 2019-07-20 MED FILL — NP THYROID 90 MG TABLET: 90 | 90 days supply | Qty: 180 | Fill #0

## 2019-08-18 ENCOUNTER — Ambulatory Visit (INDEPENDENT_AMBULATORY_CARE_PROVIDER_SITE_OTHER): Payer: 59 | Admitting: Internal Medicine

## 2019-08-25 DIAGNOSIS — D225 Melanocytic nevi of trunk: Secondary | ICD-10-CM | POA: Diagnosis not present

## 2019-08-25 DIAGNOSIS — D2271 Melanocytic nevi of right lower limb, including hip: Secondary | ICD-10-CM | POA: Diagnosis not present

## 2019-08-25 DIAGNOSIS — L570 Actinic keratosis: Secondary | ICD-10-CM | POA: Diagnosis not present

## 2019-08-25 DIAGNOSIS — L821 Other seborrheic keratosis: Secondary | ICD-10-CM | POA: Diagnosis not present

## 2019-08-25 DIAGNOSIS — D2261 Melanocytic nevi of right upper limb, including shoulder: Secondary | ICD-10-CM | POA: Diagnosis not present

## 2019-08-25 DIAGNOSIS — C44519 Basal cell carcinoma of skin of other part of trunk: Secondary | ICD-10-CM | POA: Diagnosis not present

## 2019-08-25 DIAGNOSIS — D2262 Melanocytic nevi of left upper limb, including shoulder: Secondary | ICD-10-CM | POA: Diagnosis not present

## 2019-08-25 DIAGNOSIS — D1801 Hemangioma of skin and subcutaneous tissue: Secondary | ICD-10-CM | POA: Diagnosis not present

## 2019-08-25 DIAGNOSIS — D2272 Melanocytic nevi of left lower limb, including hip: Secondary | ICD-10-CM | POA: Diagnosis not present

## 2019-08-25 DIAGNOSIS — L814 Other melanin hyperpigmentation: Secondary | ICD-10-CM | POA: Diagnosis not present

## 2019-09-07 DIAGNOSIS — C44519 Basal cell carcinoma of skin of other part of trunk: Secondary | ICD-10-CM | POA: Diagnosis not present

## 2019-09-07 DIAGNOSIS — L570 Actinic keratosis: Secondary | ICD-10-CM | POA: Diagnosis not present

## 2019-09-07 DIAGNOSIS — D485 Neoplasm of uncertain behavior of skin: Secondary | ICD-10-CM | POA: Diagnosis not present

## 2019-09-07 DIAGNOSIS — D3617 Benign neoplasm of peripheral nerves and autonomic nervous system of trunk, unspecified: Secondary | ICD-10-CM | POA: Diagnosis not present

## 2019-09-21 ENCOUNTER — Ambulatory Visit (INDEPENDENT_AMBULATORY_CARE_PROVIDER_SITE_OTHER): Payer: 59 | Admitting: Internal Medicine

## 2019-10-05 ENCOUNTER — Ambulatory Visit (INDEPENDENT_AMBULATORY_CARE_PROVIDER_SITE_OTHER): Payer: 59 | Admitting: Internal Medicine

## 2019-10-05 ENCOUNTER — Other Ambulatory Visit: Payer: Self-pay

## 2019-10-05 ENCOUNTER — Encounter (INDEPENDENT_AMBULATORY_CARE_PROVIDER_SITE_OTHER): Payer: Self-pay | Admitting: Internal Medicine

## 2019-10-05 VITALS — BP 100/70 | HR 76 | Temp 97.9°F | Ht 67.0 in | Wt 167.8 lb

## 2019-10-05 DIAGNOSIS — E782 Mixed hyperlipidemia: Secondary | ICD-10-CM | POA: Diagnosis not present

## 2019-10-05 DIAGNOSIS — Z1211 Encounter for screening for malignant neoplasm of colon: Secondary | ICD-10-CM

## 2019-10-05 DIAGNOSIS — E119 Type 2 diabetes mellitus without complications: Secondary | ICD-10-CM | POA: Diagnosis not present

## 2019-10-05 DIAGNOSIS — E039 Hypothyroidism, unspecified: Secondary | ICD-10-CM | POA: Diagnosis not present

## 2019-10-05 MED ORDER — PROGESTERONE 200 MG PO CAPS: 200.0000 mg | ORAL_CAPSULE | Freq: Every day | ORAL | 1 refills | Status: DC

## 2019-10-05 MED ORDER — METFORMIN HCL 500 MG PO TABS: 500.0000 mg | ORAL_TABLET | Freq: Two times a day (BID) | ORAL | 1 refills | Status: DC

## 2019-10-05 MED ORDER — ESTRADIOL 1 MG PO TABS: 1.5000 mg | ORAL_TABLET | Freq: Every day | ORAL | 1 refills | Status: DC

## 2019-10-05 NOTE — Progress Notes (Signed)
Metrics: Intervention Frequency ACO  Documented Smoking Status Yearly  Screened one or more times in 24 months  Cessation Counseling or  Active cessation medication Past 24 months  Past 24 months   Guideline developer: UpToDate (See UpToDate for funding source) Date Released: 2014       Wellness Office Visit  Subjective:  Patient ID: VERNEICE REXROAD, female    DOB: [birth year]  Age: 59 y.o. MRN: [id number]  CC: This lady comes in for follow-up of hypothyroidism, diabetes, hyperlipidemia and bioidentical hormone therapy and postmenopausal state. HPI  She has not been doing so well with nutrition lately and has gained 10 pounds.  She is determined to get back onto the track this week. On the last visit, all her hormones seem to be in a very good range. Her cholesterol was elevated.  Her hemoglobin A1c was 6.6%. She is due to see an ophthalmologist and also needs to have a Pap smear. Past Medical History:  Diagnosis Date  . Arthritis   . DM (diabetes mellitus) [facility]) Oct 2013  . Hyperlipidemia, mild   . Thyroid disease       Family History  Problem Relation Age of Onset  . Diabetes Mother   . Heart disease Mother   . Hypertension Mother   . Hyperlipidemia Mother   . Heart disease Father   . Arthritis Father        Rheumatoid  . Hyperlipidemia Brother   . Aneurysm Paternal Grandmother   . Other Maternal Grandmother        passed away after miscarriage  . 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   Social History   Tobacco Use  . Smoking status: Never Smoker  . Smokeless tobacco: Never Used  Substance Use Topics  . Alcohol use: Yes    Comment: occ    Current Meds  Medication Sig  . Cholecalciferol (VITAMIN D-3) 125 MCG (5000 UT) TABS Take 2 tablets by mouth daily.  . Cyanocobalamin (VITAMIN B-12 PO) Take 5,000 mcg by mouth daily.  Marland Kitchen estradiol (ESTRACE) 1 MG tablet  Take 1.5 tablets (1.5 mg total) by mouth daily.  . metFORMIN (GLUCOPHAGE) 500 MG tablet Take 1 tablet (500 mg total) by mouth 2 (two) times daily with a meal.  . Naproxen Sodium (ALEVE PO) Take by mouth as needed.  . NP THYROID 90 MG tablet Take 1 tablet (90 mg total) by mouth 2 (two) times daily.  . progesterone (PROMETRIUM) 200 MG capsule Take 1 capsule (200 mg total) by mouth daily.  . TESTOSTERONE IM Inject 5 mg into the muscle once a week.   . [DISCONTINUED] estradiol (ESTRACE) 1 MG tablet Take 1.5 tablets (1.5 mg total) by mouth daily.  . [DISCONTINUED] metFORMIN (GLUCOPHAGE) 500 MG tablet Take 1 tablet (500 mg total) by mouth 2 (two) times daily with a meal.  . [DISCONTINUED] progesterone (PROMETRIUM) 200 MG capsule Take 1 capsule (200 mg total) by mouth daily.      Objective:   Today's Vitals: BP 100/70 (BP Location: Right Arm, Patient Position: Sitting, Cuff Size: Normal)   Pulse 76   Temp 97.9 F (36.6 C) (Temporal)   Ht 5\' 7"  (1.702 m)   Wt 167 lb 12.8 oz (76.1 kg)   LMP 11/07/2015   SpO2 98%   BMI 26.28 kg/m  Vitals with BMI 10/05/2019 05/18/2019 02/24/2018  Height 5\' 7"  5\' 7"  5'  7"  Weight 167 lbs 13 oz 157 lbs 151 lbs 8 oz  BMI 26.28 0000000 123456  Systolic [id number]  Diastolic 70 68 61  Pulse 76 68 55     Physical Exam   She looks systemically well but she has gained 10 pounds in weight.  Blood pressure is excellent.  No other new physical findings.    Assessment   1. Colon cancer screening   2. Diabetes mellitus type 2, diet-controlled ([place])   3. Acquired hypothyroidism   4. Mixed hyperlipidemia       Tests ordered Orders Placed This Encounter  Procedures  . Fecal Globin By Immunochemistry  . COMPLETE METABOLIC PANEL WITH GFR  . Hemoglobin A1c  . Cardio IQ Adv Lipid and Inflamm Pnl  . T3, free     Plan: 1. Blood work is ordered above. 2. We will give her fecal occult blood testing as she is not due to have a colonoscopy according to her  gastroenterologist at the present time. 3. She will continue with Metformin for diabetes and we will see what the A1c is. 4. She will continue with desiccated NP thyroid and I believe there is room for further escalation in dose providing she tolerates it. 5. I have refilled several of her medications today. 6. Further recommendations will depend on blood results and I will see her in 3 months time for follow-up.   Meds ordered this encounter  Medications  . progesterone (PROMETRIUM) 200 MG capsule    Sig: Take 1 capsule (200 mg total) by mouth daily.    Dispense:  90 capsule    Refill:  1  . metFORMIN (GLUCOPHAGE) 500 MG tablet    Sig: Take 1 tablet (500 mg total) by mouth 2 (two) times daily with a meal.    Dispense:  180 tablet    Refill:  1  . estradiol (ESTRACE) 1 MG tablet    Sig: Take 1.5 tablets (1.5 mg total) by mouth daily.    Dispense:  135 tablet    Refill:  1    Chanon Loney Luther Parody, MD

## 2019-10-06 DIAGNOSIS — Z1211 Encounter for screening for malignant neoplasm of colon: Secondary | ICD-10-CM | POA: Diagnosis not present

## 2019-10-07 ENCOUNTER — Other Ambulatory Visit (HOSPITAL_COMMUNITY): Payer: Self-pay | Admitting: Internal Medicine

## 2019-10-07 DIAGNOSIS — Z0000 Encounter for general adult medical examination without abnormal findings: Secondary | ICD-10-CM

## 2019-10-07 LAB — FECAL GLOBIN BY IMMUNOCHEMISTRY: FECAL GLOBIN RESULT:: NOT DETECTED

## 2019-10-08 LAB — COMPLETE METABOLIC PANEL WITH GFR
AG Ratio: 1.9 (calc) (ref 1.0–2.5)
ALT: 15 U/L (ref 6–29)
AST: 16 U/L (ref 10–35)
Albumin: 4.6 g/dL (ref 3.6–5.1)
Alkaline phosphatase (APISO): 55 U/L (ref 37–153)
BUN: 19 mg/dL (ref 7–25)
CO2: 27 mmol/L (ref 20–32)
Calcium: 9.8 mg/dL (ref 8.6–10.4)
Chloride: 102 mmol/L (ref 98–110)
Creat: 0.88 mg/dL (ref 0.50–1.05)
GFR, Est African American: 84 mL/min/{1.73_m2} (ref >=60)
GFR, Est Non African American: 72 mL/min/{1.73_m2} (ref >=60)
Globulin: 2.4 g/dL (calc) (ref 1.9–3.7)
Glucose, Bld: 135 mg/dL — ABNORMAL HIGH (ref 65–99)
Potassium: 4.2 mmol/L (ref 3.5–5.3)
Sodium: 138 mmol/L (ref 135–146)
Total Bilirubin: 1.2 mg/dL (ref 0.2–1.2)
Total Protein: 7 g/dL (ref 6.1–8.1)

## 2019-10-08 LAB — CARDIO IQ ADV LIPID AND INFLAMM PNL
Apolipoprotein B: 84 mg/dL (ref <90)
Cholesterol: 218 mg/dL — ABNORMAL HIGH (ref <200)
HDL: 102 mg/dL (ref >49)
LDL Cholesterol (Calc): 105 mg/dL (calc) — ABNORMAL HIGH (ref <100)
LDL Large: 11105 nmol/L (ref >6729)
LDL Medium: 236 nmol/L — ABNORMAL HIGH (ref <215)
LDL Particle Number: 1433 nmol/L — ABNORMAL HIGH (ref <1138)
LDL Peak Size: 226.7 Angstrom (ref >222.9)
LDL Small: 229 nmol/L — ABNORMAL HIGH (ref <142)
Lipoprotein (a): 266 nmol/L — ABNORMAL HIGH (ref <75)
Non-HDL Cholesterol (Calc): 116 mg/dL (calc) (ref <130)
PLAC: 104 nmol/min/mL (ref <124)
Total CHOL/HDL Ratio: 2.1 calc (ref <3.6)
Triglycerides: 36 mg/dL (ref <150)
hs-CRP: 0.9 mg/L (ref <1.0)

## 2019-10-08 LAB — HEMOGLOBIN A1C
Hgb A1c MFr Bld: 7 % of total Hgb — ABNORMAL HIGH (ref <5.7)
Mean Plasma Glucose: 154 (calc)
eAG (mmol/L): 8.5 (calc)

## 2019-10-08 LAB — T3, FREE: T3, Free: 6.1 pg/mL — ABNORMAL HIGH (ref 2.3–4.2)

## 2019-10-20 ENCOUNTER — Ambulatory Visit (HOSPITAL_COMMUNITY)
Admission: RE | Admit: 2019-10-20 | Discharge: 2019-10-20 | Disposition: A | Discharge status: Home / Self Care | Payer: 59 | Source: Ambulatory Visit | Attending: Internal Medicine | Admitting: Internal Medicine

## 2019-10-20 ENCOUNTER — Other Ambulatory Visit: Payer: Self-pay

## 2019-10-20 DIAGNOSIS — Z1231 Encounter for screening mammogram for malignant neoplasm of breast: Secondary | ICD-10-CM | POA: Diagnosis not present

## 2019-10-20 DIAGNOSIS — Z0000 Encounter for general adult medical examination without abnormal findings: Secondary | ICD-10-CM

## 2019-10-25 ENCOUNTER — Other Ambulatory Visit (HOSPITAL_COMMUNITY)
Admission: RE | Admit: 2019-10-25 | Discharge: 2019-10-25 | Disposition: A | Discharge status: Home / Self Care | Payer: 59 | Source: Ambulatory Visit | Attending: Adult Health | Admitting: Adult Health

## 2019-10-25 ENCOUNTER — Encounter: Payer: Self-pay | Admitting: Adult Health

## 2019-10-25 ENCOUNTER — Ambulatory Visit (INDEPENDENT_AMBULATORY_CARE_PROVIDER_SITE_OTHER): Payer: 59 | Admitting: Adult Health

## 2019-10-25 ENCOUNTER — Other Ambulatory Visit: Payer: Self-pay | Admitting: Adult Health

## 2019-10-25 VITALS — BP 103/68 | HR 96 | Ht 67.0 in | Wt 165.0 lb

## 2019-10-25 DIAGNOSIS — Z1211 Encounter for screening for malignant neoplasm of colon: Secondary | ICD-10-CM | POA: Diagnosis not present

## 2019-10-25 DIAGNOSIS — L900 Lichen sclerosus et atrophicus: Secondary | ICD-10-CM

## 2019-10-25 DIAGNOSIS — Z01419 Encounter for gynecological examination (general) (routine) without abnormal findings: Secondary | ICD-10-CM

## 2019-10-25 DIAGNOSIS — Z1272 Encounter for screening for malignant neoplasm of vagina: Secondary | ICD-10-CM

## 2019-10-25 DIAGNOSIS — Z1212 Encounter for screening for malignant neoplasm of rectum: Secondary | ICD-10-CM | POA: Diagnosis not present

## 2019-10-25 LAB — HEMOCCULT GUIAC POC 1CARD (OFFICE): Fecal Occult Blood, POC: NEGATIVE

## 2019-10-25 MED ORDER — CLOBETASOL PROP EMOLLIENT BASE 0.05 % EX CREA: TOPICAL_CREAM | CUTANEOUS | 3 refills | Status: DC

## 2019-10-25 NOTE — Progress Notes (Signed)
  Subjective:     Patient ID: Jenna Carroll, female   DOB: 23-Feb-1961, 59 y.o.   MRN: [id number]  HPI Jenna Carroll is a 59 year old white female, married, PM, on HRT with PCP, had physical in December and labs with PCP. She works in [place] at [employer].  PCP is Dr Anastasio Champion.   Review of Systems Patient denies any headaches, hearing loss, fatigue, blurred vision, shortness of breath, chest pain, abdominal pain, problems with bowel movements, urination(may leak at times, wears pad), or intercourse. No joint pain or mood swings.    Objective:   Physical Exam BP 103/68 (BP Location: Left Arm, Patient Position: Sitting, Cuff Size: Normal)   Pulse 96   Ht 5\' 7"  (1.702 m)   Wt 165 lb (74.8 kg)   LMP 11/07/2015   BMI 25.84 kg/m  Skin warm and dry.Pelvic: external genitalia, she has skin tag right labia, vagina: +white thickened skin near clitoris, vaginal walls pale pink with loss of rugae,urethra has no lesions or masses noted, cervix:smooth, pap with high risk HPV 16/18 genotyping performed,uterus: normal size, shape and contour, non tender, no masses felt, adnexa: no masses or tenderness noted. Bladder is non tender and no masses felt.On rectal exam has good tone, no masses felt and hemoccult was negative. Examination chaperoned by Levy Pupa LPN    Assessment:     1. Encounter for gynecological examination with Papanicolaou smear of cervix Pap sent Pap in 3 years if normal  Physical with Dr Anastasio Champion Labs with Dr Anastasio Champion Mammogram yearly  2. Encounter for colorectal cancer screening Colonoscopy per Dr Laural Golden  3. Lichen sclerosus et atrophicus Will rx temovate, has not really had any itching, get mirror at home and look at this, she is aware it is chronic.  Meds ordered this encounter  Medications  . Clobetasol Prop Emollient Base 0.05 % emollient cream    Sig: Use bid for 2 weeks then 2-3 x a week    Dispense:  30 g    Refill:  3    Order Specific Question:   Supervising Provider    Answer:    Florian Buff [[id number]]  Showed her pictures in [facility]     Plan:     Pap in 3 years

## 2019-10-26 LAB — CYTOLOGY - PAP
Comment: NEGATIVE
Diagnosis: NEGATIVE
High risk HPV: NEGATIVE

## 2019-10-27 ENCOUNTER — Other Ambulatory Visit (INDEPENDENT_AMBULATORY_CARE_PROVIDER_SITE_OTHER): Payer: Self-pay | Admitting: Internal Medicine

## 2019-10-27 MED ORDER — ESTRADIOL 1 MG PO TABS: 1.5000 mg | ORAL_TABLET | Freq: Every day | ORAL | 1 refills | Status: DC

## 2019-10-29 ENCOUNTER — Encounter (INDEPENDENT_AMBULATORY_CARE_PROVIDER_SITE_OTHER): Payer: Self-pay | Admitting: Internal Medicine

## 2019-10-31 NOTE — Telephone Encounter (Signed)
Need new order for needles

## 2020-01-10 ENCOUNTER — Ambulatory Visit (INDEPENDENT_AMBULATORY_CARE_PROVIDER_SITE_OTHER): Payer: 59 | Admitting: Internal Medicine

## 2020-01-10 ENCOUNTER — Encounter (INDEPENDENT_AMBULATORY_CARE_PROVIDER_SITE_OTHER): Payer: Self-pay | Admitting: Internal Medicine

## 2020-01-10 ENCOUNTER — Other Ambulatory Visit: Payer: Self-pay

## 2020-01-10 VITALS — BP 130/70 | HR 80 | Temp 97.1°F | Resp 18 | Ht 67.0 in | Wt 166.0 lb

## 2020-01-10 DIAGNOSIS — E039 Hypothyroidism, unspecified: Secondary | ICD-10-CM | POA: Diagnosis not present

## 2020-01-10 DIAGNOSIS — E782 Mixed hyperlipidemia: Secondary | ICD-10-CM | POA: Diagnosis not present

## 2020-01-10 DIAGNOSIS — E119 Type 2 diabetes mellitus without complications: Secondary | ICD-10-CM | POA: Diagnosis not present

## 2020-01-10 DIAGNOSIS — E559 Vitamin D deficiency, unspecified: Secondary | ICD-10-CM | POA: Diagnosis not present

## 2020-01-10 NOTE — Progress Notes (Signed)
Metrics: Intervention Frequency ACO  Documented Smoking Status Yearly  Screened one or more times in 24 months  Cessation Counseling or  Active cessation medication Past 24 months  Past 24 months   Guideline developer: UpToDate (See UpToDate for funding source) Date Released: 2014       Wellness Office Visit  Subjective:  Patient ID: Jenna Carroll, female    DOB: 07-Dec-1960  Age: 59 y.o. MRN: [id number]  CC: This lady comes in for follow-up of diabetes, hypothyroidism, hyperlipidemia and vitamin D deficiency. HPI  Unfortunately, she has not been consistent with her nutrition and has not really lost any weight.  Her last hemoglobin A1c was around 7%.  She continues on Metformin for diabetes. She also continues on desiccated NP thyroid twice a day and her last T3 levels were optimal. She is also on bioidentical hormone therapy and all levels were in a good range previously. She continues to take vitamin D3 supplementation also has before and vitamin D levels were in a good range. Past Medical History:  Diagnosis Date  . Arthritis   . DM (diabetes mellitus) [facility]) Oct 2013  . Hyperlipidemia, mild   . Thyroid disease    Past Surgical History:  Procedure Laterality Date  . left ankle    . TONSILLECTOMY AND ADENOIDECTOMY       Family History  Problem Relation Age of Onset  . Diabetes Mother   . Heart disease Mother   . Hypertension Mother   . Hyperlipidemia Mother   . Heart disease Father   . Arthritis Father        Rheumatoid  . Hyperlipidemia Brother   . Aneurysm Paternal Grandmother   . Other Maternal Grandmother        passed away after miscarriage  . 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   Social History   Tobacco Use  . Smoking status: Never Smoker  . Smokeless tobacco: Never Used  Substance Use Topics  . Alcohol use: Yes    Comment: occ    Current Meds   Medication Sig  . Cholecalciferol (VITAMIN D-3) 125 MCG (5000 UT) TABS Take 2 tablets by mouth daily.  . Clobetasol Prop Emollient Base 0.05 % emollient cream Use bid for 2 weeks then 2-3 x a week  . Cyanocobalamin (VITAMIN B-12 PO) Take 5,000 mcg by mouth daily.  Marland Kitchen estradiol (ESTRACE) 1 MG tablet Take 1.5 tablets (1.5 mg total) by mouth daily.  . metFORMIN (GLUCOPHAGE) 500 MG tablet Take 1 tablet (500 mg total) by mouth 2 (two) times daily with a meal.  . Naproxen Sodium (ALEVE PO) Take by mouth as needed.  . NP THYROID 90 MG tablet TAKE 1 TABLET BY MOUTH TWICE DAILY  . progesterone (PROMETRIUM) 200 MG capsule Take 1 capsule (200 mg total) by mouth daily.  . TESTOSTERONE IM Inject 5 mg into the muscle once a week.        Depression screen [facility] 2/9 10/05/2019 02/24/2018 05/15/2016 04/08/2016  Decreased Interest 0 0 0 0  Down, Depressed, Hopeless 0 0 0 0  PHQ - 2 Score 0 0 0 0     Objective:   Today's Vitals: BP 130/70 (BP Location: Left Arm, Patient Position: Sitting, Cuff Size: Normal)   Pulse 80   Temp (!) 97.1 F (36.2 C) (Temporal)   Resp 18   Ht 5\' 7"  (1.702 m)  Wt 166 lb (75.3 kg)   LMP 11/07/2015   SpO2 96%   BMI 26.00 kg/m  Vitals with BMI 01/10/2020 10/25/2019 10/05/2019  Height 5\' 7"  5\' 7"  5\' 7"   Weight 166 lbs 165 lbs 167 lbs 13 oz  BMI 25.99 13.24 40.10  Systolic [phone]  Diastolic 70 68 70  Pulse 80 96 76     Physical Exam  She looks systemically well.  She remains overweight and has not lost any weight.  Blood pressure is excellent.     Assessment   1. Acquired hypothyroidism   2. Diabetes mellitus type 2, diet-controlled ([place])   3. Mixed hyperlipidemia   4. Vitamin D deficiency       Tests ordered Orders Placed This Encounter  Procedures  . Hemoglobin A1c     Plan: 1. She will continue current dose of desiccated NP thyroid for her hypothyroidism.  This is stable. 2. She will continue with Metformin I will check an A1c today. 3. She  will continue with vitamin D3 supplementation for vitamin D deficiency. 4. Further recommendations will depend on blood results and I will see her in 4 months for an annual physical exam.   No orders of the defined types were placed in this encounter.   Doree Albee, MD

## 2020-01-11 LAB — HEMOGLOBIN A1C
Hgb A1c MFr Bld: 7.2 % of total Hgb — ABNORMAL HIGH (ref <5.7)
Mean Plasma Glucose: 160 (calc)
eAG (mmol/L): 8.9 (calc)

## 2020-01-31 DIAGNOSIS — H5203 Hypermetropia, bilateral: Secondary | ICD-10-CM | POA: Diagnosis not present

## 2020-01-31 LAB — HM DIABETES EYE EXAM

## 2020-02-15 ENCOUNTER — Other Ambulatory Visit (INDEPENDENT_AMBULATORY_CARE_PROVIDER_SITE_OTHER): Payer: Self-pay | Admitting: Internal Medicine

## 2020-02-15 MED FILL — METFORMIN HCL 500 MG TABS: 500 | 90 days supply | Qty: 180 | Fill #1

## 2020-02-15 MED FILL — ESTRADIOL 1 MG TABS: 1 | 90 days supply | Qty: 135 | Fill #1

## 2020-02-15 MED FILL — PROGESTERONE 200 MG CAPS: 200 | 90 days supply | Qty: 90 | Fill #1

## 2020-02-16 NOTE — Telephone Encounter (Signed)
Request for NP thyroid refill.

## 2020-02-17 ENCOUNTER — Other Ambulatory Visit (INDEPENDENT_AMBULATORY_CARE_PROVIDER_SITE_OTHER): Payer: Self-pay | Admitting: Internal Medicine

## 2020-02-17 MED ORDER — NP THYROID 90 MG PO TABS: 90.0000 mg | ORAL_TABLET | Freq: Two times a day (BID) | ORAL | 0 refills | Status: DC

## 2020-02-17 MED FILL — NP THYROID 90 MG TABLET: 90 | 90 days supply | Qty: 180 | Fill #0

## 2020-04-19 ENCOUNTER — Ambulatory Visit (INDEPENDENT_AMBULATORY_CARE_PROVIDER_SITE_OTHER): Payer: 59 | Admitting: Internal Medicine

## 2020-04-23 MED FILL — CLOBETASOL EMOLLIENT 0.05%: 0.05 | 30 days supply | Qty: 30 | Fill #1

## 2020-04-25 ENCOUNTER — Encounter (INDEPENDENT_AMBULATORY_CARE_PROVIDER_SITE_OTHER): Payer: Self-pay | Admitting: Internal Medicine

## 2020-04-25 ENCOUNTER — Other Ambulatory Visit: Payer: Self-pay

## 2020-04-25 ENCOUNTER — Ambulatory Visit (INDEPENDENT_AMBULATORY_CARE_PROVIDER_SITE_OTHER): Payer: 59 | Admitting: Internal Medicine

## 2020-04-25 VITALS — BP 130/68 | HR 71 | Temp 97.6°F | Resp 18 | Ht 67.0 in | Wt 161.0 lb

## 2020-04-25 DIAGNOSIS — E039 Hypothyroidism, unspecified: Secondary | ICD-10-CM | POA: Diagnosis not present

## 2020-04-25 DIAGNOSIS — E119 Type 2 diabetes mellitus without complications: Secondary | ICD-10-CM

## 2020-04-25 DIAGNOSIS — E782 Mixed hyperlipidemia: Secondary | ICD-10-CM | POA: Diagnosis not present

## 2020-04-25 DIAGNOSIS — E559 Vitamin D deficiency, unspecified: Secondary | ICD-10-CM

## 2020-04-25 DIAGNOSIS — E2839 Other primary ovarian failure: Secondary | ICD-10-CM

## 2020-04-25 NOTE — Progress Notes (Signed)
Metrics: Intervention Frequency ACO  Documented Smoking Status Yearly  Screened one or more times in 24 months  Cessation Counseling or  Active cessation medication Past 24 months  Past 24 months   Guideline developer: UpToDate (See UpToDate for funding source) Date Released: 2014       Wellness Office Visit  Subjective:  Patient ID: Jenna Carroll, female    DOB: Dec 09, 1960  Age: 59 y.o. MRN: [id number]  CC: This delightful lady comes in for follow-up of diabetes, hyperlipidemia, hypothyroidism and management of bioidentical hormone therapy. HPI  She is doing very well.  She had lost more weight but she has gained some weight back but overall still is at a weight loss situation compared to the last time I saw her. She is tolerating estradiol, progesterone and testosterone therapy.  She is also tolerating NP thyroid twice a day for hypothyroidism. She continues with Metformin for diabetes. Past Medical History:  Diagnosis Date  . Arthritis   . DM (diabetes mellitus) [facility]) Oct 2013  . Hyperlipidemia, mild   . Thyroid disease    Past Surgical History:  Procedure Laterality Date  . left ankle    . TONSILLECTOMY AND ADENOIDECTOMY       Family History  Problem Relation Age of Onset  . Diabetes Mother   . Heart disease Mother   . Hypertension Mother   . Hyperlipidemia Mother   . Heart disease Father   . Arthritis Father        Rheumatoid  . Hyperlipidemia Brother   . Aneurysm Paternal Grandmother   . Other Maternal Grandmother        passed away after miscarriage  . 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   Social History   Tobacco Use  . Smoking status: Never Smoker  . Smokeless tobacco: Never Used  Substance Use Topics  . Alcohol use: Yes    Comment: occ    Current Meds  Medication Sig  . Cholecalciferol (VITAMIN D-3) 125 MCG (5000 UT) TABS Take 2 tablets by  mouth daily.  . Clobetasol Prop Emollient Base 0.05 % emollient cream Use bid for 2 weeks then 2-3 x a week  . Cyanocobalamin (VITAMIN B-12 PO) Take 5,000 mcg by mouth daily.  Marland Kitchen estradiol (ESTRACE) 1 MG tablet Take 1.5 tablets (1.5 mg total) by mouth daily.  . metFORMIN (GLUCOPHAGE) 500 MG tablet Take 1 tablet (500 mg total) by mouth 2 (two) times daily with a meal.  . Naproxen Sodium (ALEVE PO) Take by mouth as needed.  . NP THYROID 90 MG tablet Take 1 tablet (90 mg total) by mouth 2 (two) times daily.  . progesterone (PROMETRIUM) 200 MG capsule Take 1 capsule (200 mg total) by mouth daily.  . TESTOSTERONE IM Inject 5 mg into the muscle once a week.       Depression screen [facility] 2/9 10/05/2019 02/24/2018 05/15/2016 04/08/2016  Decreased Interest 0 0 0 0  Down, Depressed, Hopeless 0 0 0 0  PHQ - 2 Score 0 0 0 0     Objective:   Today's Vitals: BP 130/68 (BP Location: Right Arm, Patient Position: Sitting, Cuff Size: Normal)   Pulse 71   Temp 97.6 F (36.4 C) (Temporal)   Resp 18   Ht 5\' 7"  (1.702 m)   Wt 161 lb (73 kg)   LMP 11/07/2015   SpO2 97%   BMI  25.22 kg/m  Vitals with BMI 04/25/2020 01/10/2020 10/25/2019  Height 5\' 7"  5\' 7"  5\' 7"   Weight 161 lbs 166 lbs 165 lbs  BMI 25.21 70.34 03.52  Systolic 481 859 093  Diastolic 68 70 68  Pulse 71 80 96     Physical Exam She looks systemically well.  She has lost 5 pounds since the last time I saw her.  Blood pressure in good control.      Assessment   1. Acquired hypothyroidism   2. Diabetes mellitus type 2, diet-controlled ([place])   3. Mixed hyperlipidemia   4. Vitamin D deficiency   5. Primary ovarian failure       Tests ordered Orders Placed This Encounter  Procedures  . Lipid panel  . Hemoglobin A1c  . Progesterone  . Estradiol     Plan: 1. She will continue with NP thyroid as before.  Previous T3 levels have been in a very good range so we do not need to check them again. 2. She will continue with  identical hormone therapy and we will check estradiol and progesterone levels to make sure they are in optimal range.  I may want to increase her estradiol further to 2 mg daily based on the levels. 3. I will check a lipid panel and A1c today. 4. Further recommendations will depend on blood results and I will see her in 3 months time for an annual physical exam.   No orders of the defined types were placed in this encounter.   Doree Albee, MD

## 2020-04-26 ENCOUNTER — Other Ambulatory Visit (INDEPENDENT_AMBULATORY_CARE_PROVIDER_SITE_OTHER): Payer: Self-pay | Admitting: Internal Medicine

## 2020-04-26 LAB — LIPID PANEL
Cholesterol: 199 mg/dL (ref <200)
HDL: 94 mg/dL (ref >=50)
LDL Cholesterol (Calc): 95 mg/dL (calc)
Non-HDL Cholesterol (Calc): 105 mg/dL (calc) (ref <130)
Total CHOL/HDL Ratio: 2.1 (calc) (ref <5.0)
Triglycerides: 35 mg/dL (ref <150)

## 2020-04-26 LAB — PROGESTERONE: Progesterone: 60.2 ng/mL

## 2020-04-26 LAB — HEMOGLOBIN A1C
Hgb A1c MFr Bld: 7.1 % of total Hgb — ABNORMAL HIGH (ref <5.7)
Mean Plasma Glucose: 157 mg/dL
eAG (mmol/L): 8.7 mmol/L

## 2020-04-26 LAB — ESTRADIOL: Estradiol: 80 pg/mL

## 2020-04-26 MED ORDER — ESTRADIOL 2 MG PO TABS: 2.0000 mg | ORAL_TABLET | Freq: Every day | ORAL | 3 refills | Status: DC

## 2020-04-26 MED FILL — ESTRADIOL 2 MG TABS: 2 | 30 days supply | Qty: 30 | Fill #0

## 2020-05-01 DIAGNOSIS — E119 Type 2 diabetes mellitus without complications: Secondary | ICD-10-CM | POA: Diagnosis not present

## 2020-05-01 DIAGNOSIS — M2012 Hallux valgus (acquired), left foot: Secondary | ICD-10-CM | POA: Diagnosis not present

## 2020-05-17 ENCOUNTER — Other Ambulatory Visit (INDEPENDENT_AMBULATORY_CARE_PROVIDER_SITE_OTHER): Payer: Self-pay | Admitting: Internal Medicine

## 2020-05-17 MED FILL — PROGESTERONE 200 MG CAPS: 200 | 90 days supply | Qty: 90 | Fill #0

## 2020-05-17 MED FILL — NP THYROID 90 MG TABLET: 90 | 90 days supply | Qty: 180 | Fill #0

## 2020-05-17 MED FILL — METFORMIN HCL 500 MG TABS: 500 | 90 days supply | Qty: 180 | Fill #0

## 2020-05-20 MED FILL — ESTRADIOL 2 MG TABS: 2 | 30 days supply | Qty: 30 | Fill #1

## 2020-05-21 ENCOUNTER — Other Ambulatory Visit (INDEPENDENT_AMBULATORY_CARE_PROVIDER_SITE_OTHER): Payer: Self-pay

## 2020-05-21 ENCOUNTER — Other Ambulatory Visit (INDEPENDENT_AMBULATORY_CARE_PROVIDER_SITE_OTHER): Payer: Self-pay | Admitting: Internal Medicine

## 2020-05-21 MED ORDER — METFORMIN HCL 500 MG PO TABS
500.0000 mg | ORAL_TABLET | Freq: Two times a day (BID) | ORAL | 1 refills | Status: DC
Start: 2020-05-21 — End: 2020-05-21

## 2020-05-21 MED ORDER — PROGESTERONE 200 MG PO CAPS: 200.0000 mg | ORAL_CAPSULE | Freq: Every day | ORAL | 1 refills | Status: DC

## 2020-05-28 ENCOUNTER — Encounter (INDEPENDENT_AMBULATORY_CARE_PROVIDER_SITE_OTHER): Payer: Self-pay | Admitting: Internal Medicine

## 2020-05-29 ENCOUNTER — Other Ambulatory Visit (INDEPENDENT_AMBULATORY_CARE_PROVIDER_SITE_OTHER): Payer: Self-pay | Admitting: Internal Medicine

## 2020-05-29 MED ORDER — TESTOSTERONE 1.62 % TD GEL: 5.0000 mg | Freq: Every day | TRANSDERMAL | 1 refills | Status: DC

## 2020-06-18 MED FILL — ESTRADIOL 2 MG TABS: 2 | 30 days supply | Qty: 30 | Fill #2

## 2020-07-25 ENCOUNTER — Encounter (INDEPENDENT_AMBULATORY_CARE_PROVIDER_SITE_OTHER): Payer: 59 | Admitting: Internal Medicine

## 2020-08-07 ENCOUNTER — Other Ambulatory Visit (HOSPITAL_BASED_OUTPATIENT_CLINIC_OR_DEPARTMENT_OTHER): Payer: Self-pay

## 2020-09-27 ENCOUNTER — Other Ambulatory Visit (INDEPENDENT_AMBULATORY_CARE_PROVIDER_SITE_OTHER): Payer: Self-pay | Admitting: Internal Medicine

## 2020-09-27 ENCOUNTER — Other Ambulatory Visit (HOSPITAL_COMMUNITY): Payer: Self-pay

## 2020-09-27 MED ORDER — ESTRADIOL 2 MG PO TABS
2.0000 mg | ORAL_TABLET | Freq: Every day | ORAL | 1 refills | Status: DC
  Filled 2020-09-27: qty 90, 90d supply, fill #0
  Filled 2021-01-07: qty 90, 90d supply, fill #1

## 2020-09-29 ENCOUNTER — Other Ambulatory Visit (HOSPITAL_COMMUNITY): Payer: Self-pay

## 2020-10-17 ENCOUNTER — Other Ambulatory Visit (HOSPITAL_COMMUNITY): Payer: Self-pay

## 2020-10-17 MED FILL — Clobetasol Propionate Emollient Base Cream 0.05%: CUTANEOUS | 21 days supply | Qty: 30 | Fill #0 | Status: AC

## 2020-10-18 ENCOUNTER — Other Ambulatory Visit (HOSPITAL_COMMUNITY): Payer: Self-pay

## 2020-10-22 ENCOUNTER — Encounter (INDEPENDENT_AMBULATORY_CARE_PROVIDER_SITE_OTHER): Payer: 59 | Admitting: Internal Medicine

## 2020-10-31 DIAGNOSIS — L821 Other seborrheic keratosis: Secondary | ICD-10-CM | POA: Diagnosis not present

## 2020-10-31 DIAGNOSIS — D2372 Other benign neoplasm of skin of left lower limb, including hip: Secondary | ICD-10-CM | POA: Diagnosis not present

## 2020-10-31 DIAGNOSIS — D692 Other nonthrombocytopenic purpura: Secondary | ICD-10-CM | POA: Diagnosis not present

## 2020-10-31 DIAGNOSIS — D1801 Hemangioma of skin and subcutaneous tissue: Secondary | ICD-10-CM | POA: Diagnosis not present

## 2020-10-31 DIAGNOSIS — L738 Other specified follicular disorders: Secondary | ICD-10-CM | POA: Diagnosis not present

## 2020-10-31 DIAGNOSIS — D2271 Melanocytic nevi of right lower limb, including hip: Secondary | ICD-10-CM | POA: Diagnosis not present

## 2020-10-31 DIAGNOSIS — Z85828 Personal history of other malignant neoplasm of skin: Secondary | ICD-10-CM | POA: Diagnosis not present

## 2020-11-08 ENCOUNTER — Other Ambulatory Visit (HOSPITAL_BASED_OUTPATIENT_CLINIC_OR_DEPARTMENT_OTHER): Payer: Self-pay

## 2020-11-08 ENCOUNTER — Other Ambulatory Visit (HOSPITAL_COMMUNITY): Payer: Self-pay

## 2020-11-08 MED FILL — Metformin HCl Tab 500 MG: ORAL | 90 days supply | Qty: 180 | Fill #0 | Status: AC

## 2020-11-26 ENCOUNTER — Other Ambulatory Visit (HOSPITAL_COMMUNITY): Payer: Self-pay

## 2020-11-29 ENCOUNTER — Encounter (INDEPENDENT_AMBULATORY_CARE_PROVIDER_SITE_OTHER): Payer: 59 | Admitting: Internal Medicine

## 2021-01-01 ENCOUNTER — Encounter (INDEPENDENT_AMBULATORY_CARE_PROVIDER_SITE_OTHER): Payer: 59 | Admitting: Internal Medicine

## 2021-01-07 ENCOUNTER — Telehealth (INDEPENDENT_AMBULATORY_CARE_PROVIDER_SITE_OTHER): Payer: Self-pay

## 2021-01-07 ENCOUNTER — Other Ambulatory Visit (INDEPENDENT_AMBULATORY_CARE_PROVIDER_SITE_OTHER): Payer: Self-pay | Admitting: Internal Medicine

## 2021-01-07 ENCOUNTER — Other Ambulatory Visit (HOSPITAL_BASED_OUTPATIENT_CLINIC_OR_DEPARTMENT_OTHER): Payer: Self-pay

## 2021-01-07 ENCOUNTER — Other Ambulatory Visit (HOSPITAL_COMMUNITY): Payer: Self-pay

## 2021-01-07 DIAGNOSIS — E119 Type 2 diabetes mellitus without complications: Secondary | ICD-10-CM | POA: Diagnosis not present

## 2021-01-07 DIAGNOSIS — E039 Hypothyroidism, unspecified: Secondary | ICD-10-CM

## 2021-01-07 MED FILL — Metformin HCl Tab 500 MG: ORAL | 90 days supply | Qty: 180 | Fill #1 | Status: CN

## 2021-01-07 MED FILL — Progesterone Cap 200 MG: ORAL | 90 days supply | Qty: 90 | Fill #0 | Status: AC

## 2021-01-07 NOTE — Addendum Note (Signed)
Addended by: Ailene Ards on: 01/07/2021 03:34 PM   Modules accepted: Orders

## 2021-01-07 NOTE — Telephone Encounter (Signed)
Please let her know it has been over a year since her thyroid was checked in her blood work. I will order labs to check her thyroid and then will determine dosing changes if necessary. Please direct [facility] to [facility] as possible as our office closes next week.

## 2021-01-07 NOTE — Telephone Encounter (Signed)
Called patient and gave her the message. Patient asked to have an A1c added also since it has been over 6 months since her last test. Patient will go to [place] lab to check now. Patient verbalized an understanding.

## 2021-01-07 NOTE — Telephone Encounter (Signed)
Patient called and left a detailed voice message requesting a refill of the following medication to be sent to [facility]:  NP THYROID 90 MG tablet  Last filled 05/17/2020, # 180 with 0 refills  Will call patient to give her numbers for hormone therapy and new providers.

## 2021-01-08 ENCOUNTER — Other Ambulatory Visit (INDEPENDENT_AMBULATORY_CARE_PROVIDER_SITE_OTHER): Payer: Self-pay | Admitting: Internal Medicine

## 2021-01-08 ENCOUNTER — Encounter (INDEPENDENT_AMBULATORY_CARE_PROVIDER_SITE_OTHER): Payer: Self-pay | Admitting: Nurse Practitioner

## 2021-01-08 ENCOUNTER — Other Ambulatory Visit (INDEPENDENT_AMBULATORY_CARE_PROVIDER_SITE_OTHER): Payer: Self-pay | Admitting: Nurse Practitioner

## 2021-01-08 ENCOUNTER — Other Ambulatory Visit (HOSPITAL_COMMUNITY): Payer: Self-pay

## 2021-01-08 DIAGNOSIS — E039 Hypothyroidism, unspecified: Secondary | ICD-10-CM

## 2021-01-08 LAB — HEMOGLOBIN A1C
Hgb A1c MFr Bld: 8 % of total Hgb — ABNORMAL HIGH (ref <5.7)
Mean Plasma Glucose: 183 mg/dL
eAG (mmol/L): 10.1 mmol/L

## 2021-01-08 LAB — TSH: TSH: 0.14 mIU/L — ABNORMAL LOW (ref 0.40–4.50)

## 2021-01-08 LAB — T3, FREE: T3, Free: 4.8 pg/mL — ABNORMAL HIGH (ref 2.3–4.2)

## 2021-01-08 LAB — T4, FREE: Free T4: 1 ng/dL (ref 0.8–1.8)

## 2021-01-08 MED ORDER — THYROID 60 MG PO TABS
60.0000 mg | ORAL_TABLET | Freq: Two times a day (BID) | ORAL | 2 refills | Status: DC
  Filled 2021-01-08: qty 60, 30d supply, fill #0
  Filled 2021-02-14: qty 60, 30d supply, fill #1
  Filled 2021-03-27: qty 60, 30d supply, fill #2

## 2021-01-08 NOTE — Progress Notes (Signed)
This encounter was created in error - please disregard.

## 2021-01-14 ENCOUNTER — Other Ambulatory Visit (HOSPITAL_COMMUNITY): Payer: Self-pay

## 2021-02-14 MED FILL — Metformin HCl Tab 500 MG: ORAL | 90 days supply | Qty: 180 | Fill #1 | Status: AC

## 2021-02-15 ENCOUNTER — Other Ambulatory Visit (HOSPITAL_COMMUNITY): Payer: Self-pay

## 2021-03-27 ENCOUNTER — Other Ambulatory Visit (HOSPITAL_COMMUNITY): Payer: Self-pay

## 2021-04-04 ENCOUNTER — Encounter: Payer: Self-pay | Admitting: Adult Health

## 2021-04-04 ENCOUNTER — Other Ambulatory Visit (HOSPITAL_COMMUNITY): Payer: Self-pay

## 2021-04-04 ENCOUNTER — Other Ambulatory Visit: Payer: Self-pay

## 2021-04-04 ENCOUNTER — Ambulatory Visit (INDEPENDENT_AMBULATORY_CARE_PROVIDER_SITE_OTHER): Payer: 59 | Admitting: Adult Health

## 2021-04-04 VITALS — BP 128/76 | HR 59 | Ht 67.0 in | Wt 164.8 lb

## 2021-04-04 DIAGNOSIS — E119 Type 2 diabetes mellitus without complications: Secondary | ICD-10-CM

## 2021-04-04 DIAGNOSIS — Z1211 Encounter for screening for malignant neoplasm of colon: Secondary | ICD-10-CM

## 2021-04-04 DIAGNOSIS — Z780 Asymptomatic menopausal state: Secondary | ICD-10-CM | POA: Diagnosis not present

## 2021-04-04 DIAGNOSIS — Z79890 Hormone replacement therapy (postmenopausal): Secondary | ICD-10-CM | POA: Insufficient documentation

## 2021-04-04 DIAGNOSIS — E1165 Type 2 diabetes mellitus with hyperglycemia: Secondary | ICD-10-CM | POA: Insufficient documentation

## 2021-04-04 DIAGNOSIS — Z01419 Encounter for gynecological examination (general) (routine) without abnormal findings: Secondary | ICD-10-CM

## 2021-04-04 DIAGNOSIS — E039 Hypothyroidism, unspecified: Secondary | ICD-10-CM | POA: Diagnosis not present

## 2021-04-04 LAB — HEMOCCULT GUIAC POC 1CARD (OFFICE): Fecal Occult Blood, POC: NEGATIVE

## 2021-04-04 MED ORDER — THYROID 60 MG PO TABS
60.0000 mg | ORAL_TABLET | Freq: Two times a day (BID) | ORAL | 2 refills | Status: DC
  Filled 2021-04-04: qty 60, 30d supply, fill #0

## 2021-04-04 MED ORDER — ESTRADIOL 1 MG PO TABS
1.0000 mg | ORAL_TABLET | Freq: Every day | ORAL | 11 refills | Status: DC
  Filled 2021-04-04: qty 30, 30d supply, fill #0
  Filled 2021-05-19: qty 90, 90d supply, fill #1
  Filled 2021-07-07 – 2021-08-20 (×2): qty 90, 90d supply, fill #2
  Filled 2021-11-20: qty 90, 90d supply, fill #3
  Filled 2022-02-11: qty 60, 60d supply, fill #4

## 2021-04-04 MED ORDER — CLOBETASOL PROPIONATE 0.05 % EX OINT
1.0000 "application " | TOPICAL_OINTMENT | Freq: Two times a day (BID) | CUTANEOUS | 3 refills | Status: DC
  Filled 2021-04-04: qty 30, 15d supply, fill #0
  Filled 2021-08-20: qty 30, 15d supply, fill #1
  Filled 2021-11-05: qty 30, 15d supply, fill #2
  Filled 2021-11-20: qty 30, 15d supply, fill #3

## 2021-04-04 MED ORDER — METFORMIN HCL 500 MG PO TABS
ORAL_TABLET | ORAL | 1 refills | Status: DC
  Filled 2021-04-04: qty 180, fill #0
  Filled 2021-05-19: qty 180, 90d supply, fill #0

## 2021-04-04 MED ORDER — PROGESTERONE 200 MG PO CAPS
ORAL_CAPSULE | Freq: Every day | ORAL | 2 refills | Status: DC
  Filled 2021-04-04: qty 90, 90d supply, fill #0
  Filled 2021-08-20: qty 90, 90d supply, fill #1
  Filled 2021-11-20: qty 90, 90d supply, fill #2

## 2021-04-04 NOTE — Progress Notes (Signed)
Patient ID: Jenna Carroll, female   DOB: 02/28/1961, 60 y.o.   MRN: [id number] History of Present Illness: Jenna Carroll is a 60 year old white female, married, PM on HRT from Dr Anastasio Champion, in for well woman gyn exam. 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 She is looking for new PCP and has appt with Dr Carlota Raspberry 05/22/21 and wants referral to Dr Dorris Fetch.  Lab Results  Component Value Date   DIAGPAP  10/25/2019    - Negative for intraepithelial lesion or malignancy (NILM)   HPV NOT DETECTED 04/08/2016   [place] Negative 10/25/2019    Current Medications, Allergies, Past Medical History, Past Surgical History, Family History and Social History were reviewed in [employer] record.     Review of Systems:  Patient denies any headaches, hearing loss, fatigue, blurred vision, shortness of breath, chest pain, abdominal pain, problems with bowel movements, urination, or intercourse. No joint pain or mood swings.  Denies any vaginal bleeding  Physical Exam:BP 128/76 (BP Location: Left Arm, Patient Position: Sitting, Cuff Size: Normal)   Pulse (!) 59   Ht 5\' 7"  (1.702 m)   Wt 164 lb 12.8 oz (74.8 kg)   LMP 11/07/2015   BMI 25.81 kg/m   General:  Well developed, well nourished, no acute distress Skin:  Warm and dry Neck:  Midline trachea, normal thyroid, good ROM, no lymphadenopathy Lungs; Clear to auscultation bilaterally Breast:  No dominant palpable mass, retraction, or nipple discharge Cardiovascular: Regular rate and rhythm Abdomen:  Soft, non tender, no hepatosplenomegaly Pelvic:  External genitalia is normal in appearance, no lesions.  The vagina is pale with loss of rugae. Urethra has no lesions or masses. The cervix is smooth.  Uterus is felt to be normal size, shape, and contour.  No adnexal masses or tenderness noted.Bladder is non tender, no masses felt. Rectal: Good sphincter tone, no polyps, or hemorrhoids felt.  Hemoccult  negative. Extremities/musculoskeletal:  No swelling or varicosities noted, no clubbing or cyanosis Psych:  No mood changes, alert and cooperative,seems happy AA is 2 Fall risk is low Depression screen [facility] 2/9 04/04/2021 10/05/2019 02/24/2018  Decreased Interest 1 0 0  Down, Depressed, Hopeless 1 0 0  PHQ - 2 Score 2 0 0  Altered sleeping 1 - -  Tired, decreased energy 1 - -  Change in appetite 1 - -  Feeling bad or failure about yourself  0 - -  Trouble concentrating 0 - -  Moving slowly or fidgety/restless 0 - -  Suicidal thoughts 0 - -  PHQ-9 Score 5 - -    GAD 7 : Generalized Anxiety Score 04/04/2021  Nervous, Anxious, on Edge 1  Control/stop worrying 0  Worry too much - different things 0  Trouble relaxing 1  Restless 0  Easily annoyed or irritable 1  Afraid - awful might happen 0  Total GAD 7 Score 3      Upstream - 04/04/21 1124       Pregnancy Intention Screening   Does the patient want to become pregnant in the next year? N/A    Does the patient's partner want to become pregnant in the next year? N/A    Would the patient like to discuss contraceptive options today? N/A      Contraception Wrap Up   Current Method No Method - Other Reason   menopause   End Method No Method - Other Reason   menopause  Contraception Counseling Provided No            Examination chaperoned by Celene Squibb LPN   Impression and Plan: 1. Hypothyroidism, unspecified type Will refill NP thyroid til can see another provider Referred to Dr Dorris Fetch  - Ambulatory referral to Endocrinology - thyroid (NP THYROID) 60 MG tablet; Take 1 tablet by mouth 2 times daily.  Dispense: 60 tablet; Refill: 2  2. Type 2 diabetes mellitus without complication, without long-term current use of insulin (HCC) Refilled metformin and referred to Dr Dorris Fetch  - Ambulatory referral to Endocrinology  3. Encounter for well woman exam with routine gynecological exam Physical in 1 year Pap in 2024 Get  mammogram  Colonoscopy per Dr Laural Golden  4. Encounter for screening fecal occult blood testing  5. Postmenopause   6. Hormone replacement therapy Stop testosterone and will decrease estrace and continue Prometrium for now Meds ordered this encounter  Medications   metFORMIN (GLUCOPHAGE) 500 MG tablet    Sig: TAKE 1 TABLET BY MOUTH 2 TIMES DAILY WITH MEALS    Dispense:  180 tablet    Refill:  1    Order Specific Question:   Supervising Provider    Answer:   Tania Ade H [[id number]]   thyroid (NP THYROID) 60 MG tablet    Sig: Take 1 tablet by mouth 2 times daily.    Dispense:  60 tablet    Refill:  2    Order Specific Question:   Supervising Provider    Answer:   Tania Ade H [[id number]]   estradiol (ESTRACE) 1 MG tablet    Sig: Take 1 tablet (1 mg total) by mouth daily.    Dispense:  30 tablet    Refill:  11    Order Specific Question:   Supervising Provider    Answer:   Tania Ade H [[id number]]   progesterone (PROMETRIUM) 200 MG capsule    Sig: TAKE 1 CAPSULE BY MOUTH ONCE A DAY    Dispense:  90 capsule    Refill:  2    Order Specific Question:   Supervising Provider    Answer:   Tania Ade H [[id number]]   clobetasol ointment (TEMOVATE) 0.05 %    Sig: Apply 1 application topically 2 (two) times daily.    Dispense:  30 g    Refill:  3    Order Specific Question:   Supervising Provider    Answer:   Tania Ade H [[id number]]    Refilled temovate at her request

## 2021-04-24 ENCOUNTER — Ambulatory Visit (INDEPENDENT_AMBULATORY_CARE_PROVIDER_SITE_OTHER): Payer: 59 | Admitting: "Endocrinology

## 2021-04-24 ENCOUNTER — Other Ambulatory Visit (HOSPITAL_COMMUNITY): Payer: Self-pay

## 2021-04-24 ENCOUNTER — Encounter: Payer: Self-pay | Admitting: "Endocrinology

## 2021-04-24 ENCOUNTER — Other Ambulatory Visit: Payer: Self-pay

## 2021-04-24 VITALS — BP 126/84 | HR 64 | Ht 67.0 in | Wt 165.4 lb

## 2021-04-24 DIAGNOSIS — E119 Type 2 diabetes mellitus without complications: Secondary | ICD-10-CM | POA: Diagnosis not present

## 2021-04-24 DIAGNOSIS — I10 Essential (primary) hypertension: Secondary | ICD-10-CM | POA: Diagnosis not present

## 2021-04-24 DIAGNOSIS — E039 Hypothyroidism, unspecified: Secondary | ICD-10-CM

## 2021-04-24 DIAGNOSIS — E1169 Type 2 diabetes mellitus with other specified complication: Secondary | ICD-10-CM | POA: Insufficient documentation

## 2021-04-24 DIAGNOSIS — E782 Mixed hyperlipidemia: Secondary | ICD-10-CM

## 2021-04-24 LAB — POCT GLYCOSYLATED HEMOGLOBIN (HGB A1C): HbA1c, POC (controlled diabetic range): 7.7 % — AB (ref 0.0–7.0)

## 2021-04-24 MED ORDER — SYNTHROID 75 MCG PO TABS
75.0000 ug | ORAL_TABLET | Freq: Every day | ORAL | 1 refills | Status: DC
  Filled 2021-04-24: qty 90, 90d supply, fill #0

## 2021-04-24 NOTE — Progress Notes (Signed)
Endocrinology Consult Note       04/24/2021, 1:26 PM   Subjective:    Patient ID: Jenna Carroll, female    DOB: 1960/11/06.  Jenna Carroll is being seen in consultation for management of currently uncontrolled symptomatic diabetes requested by  Pcp, No.   Past Medical History:  Diagnosis Date   Arthritis    DM (diabetes mellitus) ([place]) 02/17/2012   Hyperlipidemia, mild    Hypothyroidism    Thyroid disease     Past Surgical History:  Procedure Laterality Date   left ankle     TONSILLECTOMY AND ADENOIDECTOMY     WISDOM TOOTH EXTRACTION      Social History   Socioeconomic History   Marital status: Married    Spouse name: Not on file   Number of children: Not on file   Years of education: Not on file   Highest education level: Not on file  Occupational History   Not on file  Tobacco Use   Smoking status: Never   Smokeless tobacco: Never  Vaping Use   Vaping Use: Never used  Substance and Sexual Activity   Alcohol use: Yes    Comment: occ   Drug use: No   Sexual activity: Yes    Birth control/protection: Post-menopausal  Other Topics Concern   Not on file  Social History Narrative   Married 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   Social Determinants of Radio broadcast assistant Strain: Unknown   Difficulty of Paying Living Expenses: Patient refused  Food Insecurity: No Food Insecurity   Worried About Charity fundraiser in the Last Year: Never true   Arboriculturist in the Last Year: Never true  Transportation Needs: No Transportation Needs   Lack of Transportation (Medical): No   Lack of Transportation (Non-Medical): No  Physical Activity: Sufficiently Active   Days of Exercise per Week: 5 days   Minutes of Exercise per Session: 30 min  Stress: Stress Concern Present   Feeling of Stress : To some extent  Social Connections: Engineer, building services  of Communication with Friends and Family: Once a week   Frequency of Social Gatherings with Friends and Family: Twice a week   Attends Religious Services: More than 4 times per year   Active Member of [employer] or Organizations: Yes   Attends Music therapist: More than 4 times per year   Marital Status: Married    Family History  Problem Relation Age of Onset   Kidney disease Mother    Diabetes Mother    Heart disease Mother    Hypertension Mother    Hyperlipidemia Mother    Osteoporosis Mother    Heart disease Father    Arthritis Father        Rheumatoid   Prostate cancer Father    Hyperlipidemia Brother    Other Maternal Grandmother        passed away after miscarriage   Leukemia Maternal Grandfather    Aneurysm Paternal Grandmother     Outpatient Encounter Medications as of 04/24/2021  Medication Sig   Coenzyme  Q10 (COQ10 PO) Take 300 mg by mouth daily.   MAGNESIUM GLUCONATE PO Take 300 mg by mouth daily.   SYNTHROID 75 MCG tablet Take 1 tablet by mouth daily before breakfast.   Cholecalciferol (VITAMIN D-3) 125 MCG (5000 UT) TABS Take 1 tablet by mouth daily.   clobetasol ointment (TEMOVATE) 3.97 % Apply 1 application topically 2 (two) times daily.   Cyanocobalamin (VITAMIN B-12 PO) Take 5,000 mcg by mouth daily. (Patient not taking: Reported on 04/24/2021)   estradiol (ESTRACE) 1 MG tablet Take 1 tablet (1 mg total) by mouth daily.   metFORMIN (GLUCOPHAGE) 500 MG tablet TAKE 1 TABLET BY MOUTH 2 TIMES DAILY WITH MEALS   Naproxen Sodium (ALEVE PO) Take by mouth as needed.   progesterone (PROMETRIUM) 200 MG capsule TAKE 1 CAPSULE BY MOUTH ONCE A DAY   [DISCONTINUED] thyroid (NP THYROID) 60 MG tablet Take 1 tablet by mouth 2 times daily.   No facility-administered encounter medications on file as of 04/24/2021.    ALLERGIES: No Known Allergies  VACCINATION STATUS: Immunization History  Administered Date(s) Administered   Influenza,inj,Quad PF,6+ Mos  02/16/2019   Influenza-Unspecified 02/21/2020   Moderna SARS-COV2 Booster Vaccination 03/26/2020   Moderna Sars-Covid-2 Vaccination 06/14/2018, 05/15/2019   Pneumococcal Polysaccharide-23 09/23/2017   Tdap 05/17/2012    Diabetes She presents for her initial diabetic visit. She has type 2 diabetes mellitus. Onset time: She was diagnosed at approximate age of 14 years. Her disease course has been improving. There are no hypoglycemic associated symptoms. Pertinent negatives for hypoglycemia include no confusion, headaches, pallor or seizures. There are no diabetic associated symptoms. Pertinent negatives for diabetes include no chest pain, no polydipsia, no polyphagia and no polyuria. There are no hypoglycemic complications. Risk factors for coronary artery disease include diabetes mellitus, post-menopausal, family history and dyslipidemia. Current diabetic treatments: She is currently on metformin 500 mg p.o. twice daily. Her weight is fluctuating minimally. She is following a generally unhealthy diet. When asked about meal planning, she reported none. (She did not bring her meter to review, however her A1c today was 7.7% improving from 8%.) An ACE inhibitor/angiotensin II receptor blocker is not being taken.  Thyroid Problem Presents for initial visit. Onset time: She was diagnosed at approximate age of 44 years.  Has taken various doses of thyroid hormone, currently on NP thyroid 60 mg daily. Patient reports no cold intolerance, diarrhea, heat intolerance or palpitations. The treatment provided moderate relief. Her past medical history is significant for diabetes and hyperlipidemia.    Review of Systems  Constitutional:  Negative for chills, fever and unexpected weight change.  HENT:  Negative for trouble swallowing and voice change.   Eyes:  Negative for visual disturbance.  Respiratory:  Negative for cough, shortness of breath and wheezing.   Cardiovascular:  Negative for chest pain,  palpitations and leg swelling.  Gastrointestinal:  Negative for diarrhea, nausea and vomiting.  Endocrine: Negative for cold intolerance, heat intolerance, polydipsia, polyphagia and polyuria.  Musculoskeletal:  Negative for arthralgias and myalgias.  Skin:  Negative for color change, pallor, rash and wound.  Neurological:  Negative for seizures and headaches.  Psychiatric/Behavioral:  Negative for confusion and suicidal ideas.    Objective:    Vitals with BMI 04/24/2021 04/04/2021 04/25/2020  Height 5\' 7"  5\' 7"  5\' 7"   Weight 165 lbs 6 oz 164 lbs 13 oz 161 lbs  BMI 25.9 67.34 19.37  Systolic 902 409 735  Diastolic 84 76 68  Pulse 64 59 71  BP 126/84   Pulse 64   Ht 5\' 7"  (1.702 m)   Wt 165 lb 6.4 oz (75 kg)   LMP 11/07/2015   BMI 25.91 kg/m   Wt Readings from Last 3 Encounters:  04/24/21 165 lb 6.4 oz (75 kg)  04/04/21 164 lb 12.8 oz (74.8 kg)  04/25/20 161 lb (73 kg)     Physical Exam Constitutional:      Appearance: She is well-developed.  HENT:     Head: Normocephalic and atraumatic.  Neck:     Thyroid: No thyromegaly.     Trachea: No tracheal deviation.  Cardiovascular:     Rate and Rhythm: Normal rate and regular rhythm.  Pulmonary:     Effort: Pulmonary effort is normal.     Breath sounds: Normal breath sounds.  Abdominal:     General: Bowel sounds are normal.     Palpations: Abdomen is soft.     Tenderness: There is no abdominal tenderness. There is no guarding.  Musculoskeletal:        General: Normal range of motion.     Cervical back: Normal range of motion and neck supple.  Skin:    General: Skin is warm and dry.     Coloration: Skin is not pale.     Findings: No erythema or rash.  Neurological:     Mental Status: She is alert and oriented to person, place, and time.     Cranial Nerves: No cranial nerve deficit.     Coordination: Coordination normal.     Deep Tendon Reflexes: Reflexes are normal and symmetric.  Psychiatric:        Judgment:  Judgment normal.      CMP ( most recent) CMP     Component Value Date/Time   NA 138 10/05/2019 0927   K 4.2 10/05/2019 0927   CL 102 10/05/2019 0927   CO2 27 10/05/2019 0927   GLUCOSE 135 (H) 10/05/2019 0927   BUN 19 10/05/2019 0927   CREATININE 0.88 10/05/2019 0927   CALCIUM 9.8 10/05/2019 0927   PROT 7.0 10/05/2019 0927   ALBUMIN 4.2 03/16/2012 0840   AST 16 10/05/2019 0927   ALT 15 10/05/2019 0927   ALKPHOS 64 03/16/2012 0840   BILITOT 1.2 10/05/2019 0927   GFRNONAA 72 10/05/2019 0927   GFRAA 84 10/05/2019 0927     Diabetic Labs (most recent): Lab Results  Component Value Date   HGBA1C 7.7 (A) 04/24/2021   HGBA1C 8.0 (H) 01/07/2021   HGBA1C 7.1 (H) 04/25/2020     Lipid Panel ( most recent) Lipid Panel     Component Value Date/Time   CHOL 199 04/25/2020 1004   TRIG 35 04/25/2020 1004   HDL 94 04/25/2020 1004   CHOLHDL 2.1 04/25/2020 1004   VLDL 7 09/23/2012 0000   LDLCALC 95 04/25/2020 1004      Lab Results  Component Value Date   TSH 0.14 (L) 01/07/2021   TSH 0.10 (L) 05/18/2019   TSH 1.187 09/23/2012   TSH 2.275 03/16/2012   FREET4 1.0 01/07/2021       Assessment & Plan:   1. Type 2 diabetes mellitus without complication, without long-term current use of insulin ([place])   - Jenna Carroll has currently uncontrolled symptomatic type 2 DM since  60 years of age,  with most recent A1c of 7.7 %. Recent labs reviewed. - I had a long discussion with her about the progressive nature of diabetes and the pathology behind its complications. -  She does not report any gross complications from her diabetes, however  she remains at a high risk for more acute and chronic complications which include CAD, CVA, CKD, retinopathy, and neuropathy. These are all discussed in detail with her.  - I discussed all available options of managing her diabetes including de-escalation of medications. I have counseled her on diet  and weight management  by adopting a Whole Food ,  Plant Predominant  ( WFPP) nutrition as recommended by [employer] of Lifestyle Medicine. Patient is encouraged to switch to  unprocessed or minimally processed  complex starch, adequate protein intake (mainly plant source), minimal liquid fat ( mainly vegetable oils), plenty of fruits, and vegetables. -  she is advised to stick to a routine mealtimes to eat 3 complete meals a day and snack only when necessary ( to snack only to correct hypoglycemia BG <70 day time or <100 at night).   - she acknowledges that there is a room for improvement in her food and drink choices. - Further Specific Suggestion is made for her to avoid simple carbohydrates  from her diet including Cakes, Sweet Desserts, Ice Cream, Soda (diet and regular), Sweet Tea, Candies, Chips, Cookies, Store Bought Juices, Alcohol in Excess of  1-2 drinks a day, Artificial Sweeteners,  Coffee Creamer, and "Sugar-free" Products. This will help patient to have more stable blood glucose profile and potentially avoid unintended weight gain.  - she will be considered for consult with Jearld Fenton, RDN, CDE for individualized diabetes education.  - I have approached her with the following plan to manage  her diabetes and patient agrees:   -In light of her currently improving glycemic profile and A1c of 7.7%, she will not need insulin treatment for now.  She is advised to continue metformin 500 mg p.o. twice daily with meals.  She will have repeat A1c next visit.  Lifestyle nutrition recommendation will help her avoid further medication escalation if she is able to adhere to it.   -She has choices of medications including weekly GLP-1 receptor agonists if she loses control of diabetes.  - Specific targets for  A1c;  LDL, HDL,  and Triglycerides were discussed with the patient.  2) Blood Pressure /Hypertension:  her blood pressure is  controlled to target.   she is not on any antihypertensive medication.    3) Lipids/Hyperlipidemia:    Review of her records show that she did have dyslipidemia including high triglycerides previously.  She is currently on co-Q10, not on statins.  The above nutrition plan will also help address dyslipidemia.  She will be considered for fasting lipid panel on subsequent visits.   4)  Weight/Diet:  Body mass index is 25.91 kg/m.  -   she is not a candidate for weight loss.  She stays active.  I discussed with her the fact that loss of 5 - 10% of her  current body weight will have the most impact on her diabetes management.  The above detailed WFPP Nutrition will help manage weight as well. Optimal Exercise, Restorative Sleep  information was detailed on discharge instructions.   5) hypothyroidism: Circumstance of her diagnosis not available to review.  She denies any prior history of thyroid surgery nor thyroid ablation.  Review of her thyroid function tests are consistent with dysregulated profile mainly due to NP thyroid.  She would benefit from levothyroxine and she agrees for switch.  I discussed and prescribed Synthroid 75 mcg p.o. daily before breakfast.   - We  discussed about the correct intake of her thyroid hormone, on empty stomach at fasting, with water, separated by at least 30 minutes from breakfast and other medications,  and separated by more than 4 hours from calcium, iron, multivitamins, acid reflux medications (PPIs). -Patient is made aware of the fact that thyroid hormone replacement is needed for life, dose to be adjusted by periodic monitoring of thyroid function tests.   6) Chronic Care/Health Maintenance:  -she  is not on ACEI/ARB and Statin medications and  is encouraged to initiate and continue to follow up with Ophthalmology, Dentist,  Podiatrist at least yearly or according to recommendations, and advised to  stay away from smoking. I have recommended yearly flu vaccine and pneumonia vaccine at least every 5 years; moderate intensity exercise for up to 150 minutes weekly; and   sleep for 7- 9 hours a day.  - she is  advised to maintain close follow up with Pcp, No for primary care needs, as well as her other providers for optimal and coordinated care.   I spent 62 minutes in the care of the patient today including review of labs from [facility], Lipids, Thyroid Function, Hematology (current and previous including abstractions from other facilities); face-to-face time discussing  her blood glucose readings/logs, discussing hypoglycemia and hyperglycemia episodes and symptoms, medications doses, her options of short and long term treatment based on the latest standards of care / guidelines;  discussion about incorporating lifestyle medicine;  and documenting the encounter.     Please refer to Patient Instructions for Blood Glucose Monitoring and Insulin/Medications Dosing Guide"  in media tab for additional information. Please  also refer to " Patient Self Inventory" in the Media  tab for reviewed elements of pertinent patient history.  Jenna Carroll participated in the discussions, expressed understanding, and voiced agreement with the above plans.  All questions were answered to her satisfaction. she is encouraged to contact clinic should she have any questions or concerns prior to her return visit.   Follow up plan: - Return in about 3 months (around 07/23/2021) for F/U with Pre-visit Labs, A1c -NV.  Jenna Lloyd, MD [facility] [address] [place],  [address] Phone: [phone]  Fax: [phone]    04/24/2021, 1:26 PM  This note was partially dictated with voice recognition software. Similar sounding words can be transcribed inadequately or may not  be corrected upon review.

## 2021-04-24 NOTE — Patient Instructions (Signed)

## 2021-05-07 DIAGNOSIS — E119 Type 2 diabetes mellitus without complications: Secondary | ICD-10-CM | POA: Diagnosis not present

## 2021-05-07 DIAGNOSIS — L859 Epidermal thickening, unspecified: Secondary | ICD-10-CM | POA: Diagnosis not present

## 2021-05-07 DIAGNOSIS — M2012 Hallux valgus (acquired), left foot: Secondary | ICD-10-CM | POA: Diagnosis not present

## 2021-05-20 ENCOUNTER — Other Ambulatory Visit (HOSPITAL_COMMUNITY): Payer: Self-pay

## 2021-05-22 ENCOUNTER — Other Ambulatory Visit (HOSPITAL_COMMUNITY): Payer: Self-pay

## 2021-05-22 ENCOUNTER — Encounter: Payer: Self-pay | Admitting: Family Medicine

## 2021-05-22 ENCOUNTER — Ambulatory Visit: Payer: 59 | Admitting: Family Medicine

## 2021-05-22 VITALS — BP 116/74 | HR 87 | Temp 98.2°F | Resp 18 | Ht 67.0 in | Wt 164.8 lb

## 2021-05-22 DIAGNOSIS — E1165 Type 2 diabetes mellitus with hyperglycemia: Secondary | ICD-10-CM | POA: Diagnosis not present

## 2021-05-22 DIAGNOSIS — Z13220 Encounter for screening for lipoid disorders: Secondary | ICD-10-CM | POA: Diagnosis not present

## 2021-05-22 DIAGNOSIS — E039 Hypothyroidism, unspecified: Secondary | ICD-10-CM | POA: Diagnosis not present

## 2021-05-22 MED ORDER — METFORMIN HCL 850 MG PO TABS
850.0000 mg | ORAL_TABLET | Freq: Two times a day (BID) | ORAL | 1 refills | Status: DC
Start: 2021-05-22 — End: 2021-10-30
  Filled 2021-05-22: qty 180, 90d supply, fill #0
  Filled 2021-08-20: qty 180, 90d supply, fill #1

## 2021-05-22 NOTE — Patient Instructions (Addendum)
Continue to work on 3 meals per day. Increase metformin to 850mg  for now. That and diet changes along with exercise should  improve the A1c. Repeat A1c in 2 months.   Fasting lab visit in next 2 weeks. Depending on A1c and lipids can discuss possible med changes if needed.   Thanks for coming in today. Please let me know if there are questions.

## 2021-05-22 NOTE — Progress Notes (Signed)
Subjective:  Patient ID: Jenna Carroll, female    DOB: [birth year]  Age: 61 y.o. MRN: [id number]  CC:  Chief Complaint  Patient presents with   New Patient (Initial Visit)    Pt here to establish care was Dr Anastasio Champion patient     HPI [name] presents for   New patient to establish care.  Previous primary care provider Dr. Anastasio Champion.  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   Last note reviewed from previous primary care provider in December 2021.  Treated for diabetes, hyperlipidemia, hypothyroidism and management of bioidentical hormone therapy.  OB/GYN -Derrek Monaco, NP at [facility]. Appointment November 17, stopped testosterone, decreased estrogen to estradiol 1 mg daily, continued prometrium for now.   Hypothyroidism: Lab Results  Component Value Date   TSH 0.14 (L) 01/07/2021  Treated with NP thyroid 60 mg twice daily previously, evaluated by endocrinology December 7, dysregulated profile mainly due to NP thyroid.  Changed to Synthroid 75 mcg QD. Taking daily. Same dose past month.   Diabetes: Complicated by hyperglycemia, recent visit with endocrinology as above.  Plan for improved diet choices.  Plan for possible evaluation with diabetic educator.  Continued on metformin 500 mg twice daily. No current statin, no current ACE inhibitor.  She does take co-Q10.  Possible covid over the summer, fluctuating blood sugars. Up to 300-400 when sick, slight elevation after vaccine.  Booster in May, few weeks later suspected covid infection.  Recent respiratory infection past few weeks, improving.  Stress with aging parents.  Less exercise recently. Up to 10k steps yesterday.  Home readings 150's, 181 yesterday.  No SE of metformin, but no higher doses recently.  Skipping meals at times.  Not fasting today.   Microalbumin: today, ratio was elevated on labs in 2014 Optho, foot exam, pneumovax:  Ophthalmology exam: [facility] in [place] - due.  Pneumonia vaccine  -09/23/2017, Pneumovax. COVID primary series in 2020 with booster in November 2021.plans on bivalent booster soon.  Flu vaccine in October of this year. Shingles vaccine: defers - plans on next visit.  Colonoscopy: plans this year.   Immunization History  Administered Date(s) Administered   Influenza,inj,Quad PF,6+ Mos 02/16/2019   Influenza-Unspecified 02/21/2020, 03/07/2021   Moderna SARS-COV2 Booster Vaccination 03/26/2020   Moderna Sars-Covid-2 Vaccination 06/14/2018, 05/15/2019   Pneumococcal Polysaccharide-23 09/23/2017   Tdap 05/17/2012     Lab Results  Component Value Date   HGBA1C 7.7 (A) 04/24/2021   HGBA1C 8.0 (H) 01/07/2021   HGBA1C 7.1 (H) 04/25/2020   Lab Results  Component Value Date   MICROALBUR 19.01 (H) 09/28/2012   [place] 95 04/25/2020   CREATININE 0.88 10/05/2019     HM: Occult stool negative at GYN 04/04/21. Pap in 2024, mammogram planned, colonoscopy per GI - Dr. Laural Golden.  Due for repeat colonoscopy - will schedule this year.      History Patient Active Problem List   Diagnosis Date Noted   Mixed hyperlipidemia 04/24/2021   Hormone replacement therapy 04/04/2021   Postmenopause 04/04/2021   Encounter for screening fecal occult blood testing 04/04/2021   Type 2 diabetes mellitus without complication, without long-term current use of insulin ([place]) 04/04/2021   Encounter for colorectal cancer screening 95/18/8416   Lichen sclerosus et atrophicus 10/25/2019   Screening for colorectal cancer 02/24/2018   Encounter for well woman exam with routine gynecological exam 02/24/2018   Encounter for gynecological examination with Papanicolaou smear of cervix 04/08/2016   IUD (intrauterine device) in place 04/08/2016  Diabetes mellitus type 2, diet-controlled ([place]) 09/29/2012   Microalbuminuria 09/29/2012   UTI (urinary tract infection) 09/29/2012   Skin moles 09/29/2012   Hypothyroidism 03/16/2012   Arthritis 03/16/2012   Vitamin D deficiency  03/16/2012   Past Medical History:  Diagnosis Date   Arthritis    DM (diabetes mellitus) ([place]) 02/17/2012   Hyperlipidemia, mild    Hypothyroidism    Thyroid disease    Past Surgical History:  Procedure Laterality Date   left ankle     TONSILLECTOMY AND ADENOIDECTOMY     WISDOM TOOTH EXTRACTION     No Known Allergies Prior to Admission medications   Medication Sig Start Date End Date Taking? Authorizing Provider  Cholecalciferol (VITAMIN D-3) 125 MCG (5000 UT) TABS Take 1 tablet by mouth daily.    [provider]  clobetasol ointment (TEMOVATE) 2.02 % Apply 1 application topically 2 (two) times daily. 04/04/21   Estill Dooms, NP  Coenzyme Q10 (COQ10 PO) Take 300 mg by mouth daily.    [provider]  Cyanocobalamin (VITAMIN B-12 PO) Take 5,000 mcg by mouth daily. Patient not taking: Reported on 04/24/2021    [provider]  estradiol (ESTRACE) 1 MG tablet Take 1 tablet (1 mg total) by mouth daily. 04/04/21 04/04/22  Estill Dooms, NP  MAGNESIUM GLUCONATE PO Take 300 mg by mouth daily.    [provider]  metFORMIN (GLUCOPHAGE) 500 MG tablet TAKE 1 TABLET BY MOUTH 2 TIMES DAILY WITH A MEAL 04/04/21 04/04/22  Derrek Monaco A, NP  Naproxen Sodium (ALEVE PO) Take by mouth as needed.    [provider]  progesterone (PROMETRIUM) 200 MG capsule TAKE 1 CAPSULE BY MOUTH ONCE A DAY 04/04/21 04/04/22  Estill Dooms, NP  SYNTHROID 75 MCG tablet Take 1 tablet by mouth daily before breakfast. 04/24/21   Nida, Marella Chimes, MD   Social History   Socioeconomic History   Marital status: Married    Spouse name: Not on file   Number of children: Not on file   Years of education: Not on file   Highest education level: Not on file  Occupational History   Not on file  Tobacco Use   Smoking status: Never   Smokeless tobacco: Never  Vaping Use   Vaping Use: Never used  Substance and Sexual Activity   Alcohol use: Yes     Comment: occ   Drug use: No   Sexual activity: Yes    Birth control/protection: Post-menopausal  Other Topics Concern   Not on file  Social History Narrative   Married 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   Social Determinants of Radio broadcast assistant Strain: Unknown   Difficulty of Paying Living Expenses: Patient refused  Food Insecurity: No Food Insecurity   Worried About Charity fundraiser in the Last Year: Never true   Arboriculturist in the Last Year: Never true  Transportation Needs: No Transportation Needs   Lack of Transportation (Medical): No   Lack of Transportation (Non-Medical): No  Physical Activity: Sufficiently Active   Days of Exercise per Week: 5 days   Minutes of Exercise per Session: 30 min  Stress: Stress Concern Present   Feeling of Stress : To some extent  Social Connections: Engineer, building services of Communication with Friends and Family: Once a week   Frequency of Social Gatherings with Friends and Family: Twice a week   Attends Religious Services: More than 4  times per year   Active Member of Clubs or Organizations: Yes   Attends Music therapist: More than 4 times per year   Marital Status: Married  Human resources officer Violence: Not At Risk   Fear of Current or Ex-Partner: No   Emotionally Abused: No   Physically Abused: No   Sexually Abused: No    Review of Systems  Constitutional:  Negative for fatigue and unexpected weight change.  Respiratory:  Negative for chest tightness and shortness of breath.   Cardiovascular:  Negative for chest pain, palpitations and leg swelling.  Gastrointestinal:  Negative for abdominal pain and blood in stool.  Neurological:  Negative for dizziness, syncope, light-headedness and headaches (sinus HA with recent infection - improving.).    Objective:   Vitals:   05/22/21 1018  BP: 116/74  Pulse: 87  Resp: 18  Temp: 98.2 F (36.8 C)  TempSrc: Temporal  SpO2: 97%   Weight: 164 lb 12.8 oz (74.8 kg)  Height: 5\' 7"  (1.702 m)     Physical Exam Vitals reviewed.  Constitutional:      Appearance: Normal appearance. She is well-developed.  HENT:     Head: Normocephalic and atraumatic.  Eyes:     Conjunctiva/sclera: Conjunctivae normal.     Pupils: Pupils are equal, round, and reactive to light.  Neck:     Vascular: No carotid bruit.  Cardiovascular:     Rate and Rhythm: Normal rate and regular rhythm.     Heart sounds: Normal heart sounds.  Pulmonary:     Effort: Pulmonary effort is normal.     Breath sounds: Normal breath sounds.  Abdominal:     Palpations: Abdomen is soft. There is no pulsatile mass.     Tenderness: There is no abdominal tenderness.  Musculoskeletal:     Right lower leg: No edema.     Left lower leg: No edema.  Skin:    General: Skin is warm and dry.  Neurological:     Mental Status: She is alert and oriented to person, place, and time.  Psychiatric:        Mood and Affect: Mood normal.        Behavior: Behavior normal.     50 minutes spent during visit, including chart review, prior note, specialist and lab review, counseling and assimilation of information, exam, discussion of plan, and chart completion.   Assessment & Plan:  DAYLENE VANDENBOSCH is a 61 y.o. female . Type 2 diabetes mellitus with hyperglycemia, without long-term current use of insulin (HCC) - Plan: Microalbumin / creatinine urine ratio, Comprehensive metabolic panel, metFORMIN (GLUCOPHAGE) 850 MG tablet  -Decreased control based on most recent A1c, persistent elevated readings.  Increase metformin to 850 mg twice daily, recheck A1c next few months.  Continue diet and exercise approach as well.  Screening for hyperlipidemia - Plan: Comprehensive metabolic panel, Lipid panel  Hypothyroidism, unspecified type - Plan: TSH  -Check TSH on new regimen of levothyroxine in place of NP thyroid.  I will follow  for diabetes and hypothyroidism on initial follow-up  with option to continue follow-up with endocrinology as well.  Meds ordered this encounter  Medications   metFORMIN (GLUCOPHAGE) 850 MG tablet    Sig: Take 1 tablet (850 mg total) by mouth 2 (two) times daily with a meal.    Dispense:  180 tablet    Refill:  1   Patient Instructions  Continue to work on 3 meals per day. Increase metformin to 850mg  for  now. That and diet changes along with exercise should  improve the A1c. Repeat A1c in 2 months.   Fasting lab visit in next 2 weeks. Depending on A1c and lipids can discuss possible med changes if needed.   Thanks for coming in today. Please let me know if there are questions.        Signed,   Merri Ray, MD [place] 05/22/21 10:21 AM

## 2021-05-29 ENCOUNTER — Other Ambulatory Visit: Payer: 59

## 2021-05-29 ENCOUNTER — Other Ambulatory Visit (INDEPENDENT_AMBULATORY_CARE_PROVIDER_SITE_OTHER): Payer: 59

## 2021-05-29 DIAGNOSIS — E039 Hypothyroidism, unspecified: Secondary | ICD-10-CM | POA: Diagnosis not present

## 2021-05-29 DIAGNOSIS — E1165 Type 2 diabetes mellitus with hyperglycemia: Secondary | ICD-10-CM

## 2021-05-29 DIAGNOSIS — Z13220 Encounter for screening for lipoid disorders: Secondary | ICD-10-CM

## 2021-05-29 LAB — COMPREHENSIVE METABOLIC PANEL
ALT: 9 U/L (ref 0–35)
AST: 13 U/L (ref 0–37)
Albumin: 4.6 g/dL (ref 3.5–5.2)
Alkaline Phosphatase: 71 U/L (ref 39–117)
BUN: 19 mg/dL (ref 6–23)
CO2: 29 mEq/L (ref 19–32)
Calcium: 9.5 mg/dL (ref 8.4–10.5)
Chloride: 100 mEq/L (ref 96–112)
Creatinine, Ser: 0.87 mg/dL (ref 0.40–1.20)
GFR: 72.8 mL/min (ref >60.00)
Glucose, Bld: 145 mg/dL — ABNORMAL HIGH (ref 70–99)
Potassium: 3.9 mEq/L (ref 3.5–5.1)
Sodium: 137 mEq/L (ref 135–145)
Total Bilirubin: 1.3 mg/dL — ABNORMAL HIGH (ref 0.2–1.2)
Total Protein: 7.4 g/dL (ref 6.0–8.3)

## 2021-05-29 LAB — LIPID PANEL
Cholesterol: 254 mg/dL — ABNORMAL HIGH (ref 0–200)
HDL: 113.2 mg/dL (ref >39.00)
LDL Cholesterol: 132 mg/dL — ABNORMAL HIGH (ref 0–99)
NonHDL: 140.64
Total CHOL/HDL Ratio: 2
Triglycerides: 41 mg/dL (ref 0.0–149.0)
VLDL: 8.2 mg/dL (ref 0.0–40.0)

## 2021-05-29 LAB — MICROALBUMIN / CREATININE URINE RATIO
Creatinine,U: 12.5 mg/dL
Microalb Creat Ratio: 5.6 mg/g (ref 0.0–30.0)
Microalb, Ur: <0.7 mg/dL (ref 0.0–1.9)

## 2021-05-29 LAB — TSH: TSH: 15.71 u[IU]/mL — ABNORMAL HIGH (ref 0.35–5.50)

## 2021-06-07 ENCOUNTER — Encounter: Payer: Self-pay | Admitting: Family Medicine

## 2021-07-08 ENCOUNTER — Other Ambulatory Visit (HOSPITAL_COMMUNITY): Payer: Self-pay

## 2021-07-10 ENCOUNTER — Other Ambulatory Visit (HOSPITAL_COMMUNITY): Payer: Self-pay | Admitting: Family Medicine

## 2021-07-10 DIAGNOSIS — Z1231 Encounter for screening mammogram for malignant neoplasm of breast: Secondary | ICD-10-CM

## 2021-07-15 ENCOUNTER — Ambulatory Visit (HOSPITAL_COMMUNITY)
Admission: RE | Admit: 2021-07-15 | Discharge: 2021-07-15 | Disposition: A | Discharge status: Home / Self Care | Payer: 59 | Source: Ambulatory Visit | Attending: Family Medicine | Admitting: Family Medicine

## 2021-07-15 ENCOUNTER — Other Ambulatory Visit: Payer: Self-pay

## 2021-07-15 DIAGNOSIS — Z1231 Encounter for screening mammogram for malignant neoplasm of breast: Secondary | ICD-10-CM | POA: Diagnosis not present

## 2021-07-24 ENCOUNTER — Other Ambulatory Visit (HOSPITAL_COMMUNITY): Payer: Self-pay

## 2021-07-24 ENCOUNTER — Encounter: Payer: Self-pay | Admitting: Family Medicine

## 2021-07-24 ENCOUNTER — Ambulatory Visit: Payer: 59 | Admitting: Family Medicine

## 2021-07-24 ENCOUNTER — Ambulatory Visit: Payer: 59 | Admitting: "Endocrinology

## 2021-07-24 VITALS — BP 134/60 | HR 54 | Temp 98.1°F | Resp 16 | Ht 67.0 in | Wt 170.4 lb

## 2021-07-24 DIAGNOSIS — E039 Hypothyroidism, unspecified: Secondary | ICD-10-CM

## 2021-07-24 DIAGNOSIS — E1165 Type 2 diabetes mellitus with hyperglycemia: Secondary | ICD-10-CM

## 2021-07-24 MED ORDER — LEVOTHYROXINE SODIUM 88 MCG PO TABS
88.0000 ug | ORAL_TABLET | Freq: Every day | ORAL | 3 refills | Status: DC
  Filled 2021-07-24: qty 90, 90d supply, fill #0
  Filled 2021-10-10: qty 90, 90d supply, fill #1

## 2021-07-24 NOTE — Progress Notes (Signed)
Subjective:  Patient ID: Jenna Carroll, female    DOB: [birth year]  Age: 61 y.o. MRN: [id number]  CC:  Chief Complaint  Patient presents with   Diabetes    Pt has been doing well, no concerns no need for refill is due for recheck    Hypothyroidism    Pt is in need of refill today and labs     HPI Jenna Carroll presents for   Diabetes: With hyperglycemia.  Increased to metformin '850mg'$  BID - some diarrhea on higher dose, but ok at '850mg'$  in am, '500mg'$  at night.  Diet - some improvement. Some social stressors impacting exercise - getting better. Getting clse to 7500 steps per day - at least 5k per day.  Microalbumin: normal ratio 05/29/21.  Optho, foot exam, pneumovax:  Optho a week or two ago - [facility] in [place]. 07/10/21.  Scheduling colonoscopy.   Diabetic Foot Exam - Simple   No data filed    Lab Results  Component Value Date   HGBA1C 7.7 (A) 04/24/2021   HGBA1C 8.0 (H) 01/07/2021   HGBA1C 7.1 (H) 04/25/2020   Lab Results  Component Value Date   MICROALBUR <0.7 05/29/2021   [place] 132 (H) 05/29/2021   CREATININE 0.87 05/29/2021   Hypothyroidism: Lab Results  Component Value Date   TSH 15.71 (H) 05/29/2021  Taking medication daily.  Synthroid 75 mcg (changed from [place] thyroid in December). Option to increase to 88 mcg daily on recent labs.  Some new cold intolerance. No new hair or skin changes, rare heart palpitations or new fatigue. No new weight changes.   History Patient Active Problem List   Diagnosis Date Noted   Mixed hyperlipidemia 04/24/2021   Hormone replacement therapy 04/04/2021   Postmenopause 04/04/2021   Encounter for screening fecal occult blood testing 04/04/2021   Type 2 diabetes mellitus without complication, without long-term current use of insulin ([place]) 04/04/2021   Encounter for colorectal cancer screening 24/40/1027   Lichen sclerosus et atrophicus 10/25/2019   Screening for colorectal cancer 02/24/2018   Encounter for well woman  exam with routine gynecological exam 02/24/2018   Encounter for gynecological examination with Papanicolaou smear of cervix 04/08/2016   IUD (intrauterine device) in place 04/08/2016   Diabetes mellitus type 2, diet-controlled ([place]) 09/29/2012   Microalbuminuria 09/29/2012   UTI (urinary tract infection) 09/29/2012   Skin moles 09/29/2012   Hypothyroidism 03/16/2012   Arthritis 03/16/2012   Vitamin D deficiency 03/16/2012   Past Medical History:  Diagnosis Date   Arthritis    DM (diabetes mellitus) ([place]) 02/17/2012   Hyperlipidemia, mild    Hypothyroidism    Thyroid disease    Past Surgical History:  Procedure Laterality Date   left ankle     TONSILLECTOMY AND ADENOIDECTOMY     WISDOM TOOTH EXTRACTION     No Known Allergies Prior to Admission medications   Medication Sig Start Date End Date Taking? Authorizing Provider  Cholecalciferol (VITAMIN D-3) 125 MCG (5000 UT) TABS Take 1 tablet by mouth daily.   Yes [provider]  clobetasol ointment (TEMOVATE) 2.53 % Apply 1 application topically 2 (two) times daily. 04/04/21  Yes Derrek Monaco A, NP  Coenzyme Q10 (COQ10 PO) Take 300 mg by mouth daily.   Yes [provider]  Cyanocobalamin (VITAMIN B-12 PO) Take 5,000 mcg by mouth daily.   Yes [provider]  estradiol (ESTRACE) 1 MG tablet Take 1 tablet (1 mg total) by mouth daily. 04/04/21 04/04/22 Yes Laurann Montana,  Jennifer A, NP  MAGNESIUM GLUCONATE PO Take 300 mg by mouth daily.   Yes [provider]  metFORMIN (GLUCOPHAGE) 850 MG tablet Take 1 tablet (850 mg total) by mouth 2 (two) times daily with a meal. 05/22/21  Yes Wendie Agreste, MD  Naproxen Sodium (ALEVE PO) Take by mouth as needed.   Yes [provider]  progesterone (PROMETRIUM) 200 MG capsule TAKE 1 CAPSULE BY MOUTH ONCE A DAY 04/04/21 04/04/22 Yes Estill Dooms, NP  SYNTHROID 75 MCG tablet Take 1 tablet by mouth daily before breakfast. 04/24/21  Yes Nida,  Marella Chimes, MD   Social History   Socioeconomic History   Marital status: Married    Spouse name: Not on file   Number of children: Not on file   Years of education: Not on file   Highest education level: Not on file  Occupational History   Not on file  Tobacco Use   Smoking status: Never   Smokeless tobacco: Never  Vaping Use   Vaping Use: Never used  Substance and Sexual Activity   Alcohol use: Yes    Comment: occ   Drug use: No   Sexual activity: Yes    Birth control/protection: Post-menopausal  Other Topics Concern   Not on file  Social History Narrative   Married 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   Social Determinants of Radio broadcast assistant Strain: Unknown   Difficulty of Paying Living Expenses: Patient refused  Food Insecurity: No Food Insecurity   Worried About Charity fundraiser in the Last Year: Never true   Arboriculturist in the Last Year: Never true  Transportation Needs: No Transportation Needs   Lack of Transportation (Medical): No   Lack of Transportation (Non-Medical): No  Physical Activity: Sufficiently Active   Days of Exercise per Week: 5 days   Minutes of Exercise per Session: 30 min  Stress: Stress Concern Present   Feeling of Stress : To some extent  Social Connections: Engineer, building services of Communication with Friends and Family: Once a week   Frequency of Social Gatherings with Friends and Family: Twice a week   Attends Religious Services: More than 4 times per year   Active Member of [employer] or Organizations: Yes   Attends Music therapist: More than 4 times per year   Marital Status: Married  Human resources officer Violence: Not At Risk   Fear of Current or Ex-Partner: No   Emotionally Abused: No   Physically Abused: No   Sexually Abused: No    Review of Systems   Objective:   Vitals:   07/24/21 1435  BP: 134/60  Pulse: (!) 54  Resp: 16  Temp: 98.1 F (36.7 C)  TempSrc:  Temporal  SpO2: 98%  Weight: 170 lb 6.4 oz (77.3 kg)  Height: '5\' 7"'$  (1.702 m)    Physical Exam Vitals reviewed.  Constitutional:      Appearance: Normal appearance. She is well-developed.  HENT:     Head: Normocephalic and atraumatic.  Eyes:     Conjunctiva/sclera: Conjunctivae normal.     Pupils: Pupils are equal, round, and reactive to light.  Neck:     Vascular: No carotid bruit.  Cardiovascular:     Rate and Rhythm: Normal rate and regular rhythm.     Heart sounds: Normal heart sounds.  Pulmonary:     Effort: Pulmonary effort is normal.     Breath sounds: Normal breath  sounds.  Abdominal:     Palpations: Abdomen is soft. There is no pulsatile mass.     Tenderness: There is no abdominal tenderness.  Musculoskeletal:     Right lower leg: No edema.     Left lower leg: No edema.  Skin:    General: Skin is warm and dry.  Neurological:     Mental Status: She is alert and oriented to person, place, and time.  Psychiatric:        Mood and Affect: Mood normal.        Behavior: Behavior normal.     Assessment & Plan:  Jenna Carroll is a 61 y.o. female . Type 2 diabetes mellitus with hyperglycemia, without long-term current use of insulin (HCC) - Plan: Hemoglobin A1c  -Check A1c, some GI intolerance to metformin but she would like to continue to try the 850 mg dose with plans on twice daily dosing.  Option to return back to 500 mg twice daily with other additive agent if needed.  Continue work with diet and exercise, check A1c, 52-monthfollow-up.  Hypothyroidism, unspecified type - Plan: TSH Symptoms of hypothyroidism and based on last TSH will increase to 88 mcg daily as only 2 days of meds left.  If normal TSH today, revert to 75 mcg.   Meds ordered this encounter  Medications   levothyroxine (SYNTHROID) 88 MCG tablet    Sig: Take 1 tablet (88 mcg total) by mouth daily.    Dispense:  90 tablet    Refill:  3   Patient Instructions  If GI upset at '850mg'$  metformin  dosing, return to '500mg'$  and let me know. We can add other agent if needed.   I suspect you will need a higher dose of Synthroid, I did send the 88 mcg to your pharmacy.  If the TSH is normal on test from today, we can resubmit the 75 mcg.  Recheck in 3 months for diabetes.  Keep up the good work with diet, exercise.  Let me know if there are questions.        Signed,   JMerri Ray MD [facility] 07/24/21 3:14 PM

## 2021-07-24 NOTE — Patient Instructions (Addendum)
If GI upset at '850mg'$  metformin dosing, return to '500mg'$  and let me know. We can add other agent if needed.  ? ?I suspect you will need a higher dose of Synthroid, I did send the 88 mcg to your pharmacy.  If the TSH is normal on test from today, we can resubmit the 75 mcg.  Recheck in 3 months for diabetes.  Keep up the good work with diet, exercise.  Let me know if there are questions. ? ? ? ? ?

## 2021-07-25 LAB — TSH: TSH: 14.17 u[IU]/mL — ABNORMAL HIGH (ref 0.35–5.50)

## 2021-07-25 LAB — HEMOGLOBIN A1C: Hgb A1c MFr Bld: 8.1 % — ABNORMAL HIGH (ref 4.6–6.5)

## 2021-08-13 ENCOUNTER — Encounter: Payer: Self-pay | Admitting: Family Medicine

## 2021-08-20 ENCOUNTER — Other Ambulatory Visit (HOSPITAL_COMMUNITY): Payer: Self-pay

## 2021-10-11 ENCOUNTER — Other Ambulatory Visit (HOSPITAL_COMMUNITY): Payer: Self-pay

## 2021-10-30 ENCOUNTER — Other Ambulatory Visit (HOSPITAL_COMMUNITY): Payer: Self-pay

## 2021-10-30 ENCOUNTER — Ambulatory Visit: Payer: 59 | Admitting: Family Medicine

## 2021-10-30 ENCOUNTER — Encounter: Payer: Self-pay | Admitting: Family Medicine

## 2021-10-30 VITALS — BP 126/72 | HR 62 | Temp 98.2°F | Resp 16 | Ht 67.0 in | Wt 171.8 lb

## 2021-10-30 DIAGNOSIS — L821 Other seborrheic keratosis: Secondary | ICD-10-CM | POA: Diagnosis not present

## 2021-10-30 DIAGNOSIS — Z85828 Personal history of other malignant neoplasm of skin: Secondary | ICD-10-CM | POA: Diagnosis not present

## 2021-10-30 DIAGNOSIS — L578 Other skin changes due to chronic exposure to nonionizing radiation: Secondary | ICD-10-CM | POA: Diagnosis not present

## 2021-10-30 DIAGNOSIS — D1801 Hemangioma of skin and subcutaneous tissue: Secondary | ICD-10-CM | POA: Diagnosis not present

## 2021-10-30 DIAGNOSIS — E1165 Type 2 diabetes mellitus with hyperglycemia: Secondary | ICD-10-CM

## 2021-10-30 DIAGNOSIS — Z23 Encounter for immunization: Secondary | ICD-10-CM | POA: Diagnosis not present

## 2021-10-30 DIAGNOSIS — E039 Hypothyroidism, unspecified: Secondary | ICD-10-CM

## 2021-10-30 DIAGNOSIS — L814 Other melanin hyperpigmentation: Secondary | ICD-10-CM | POA: Diagnosis not present

## 2021-10-30 MED ORDER — METFORMIN HCL 850 MG PO TABS
850.0000 mg | ORAL_TABLET | Freq: Two times a day (BID) | ORAL | 1 refills | Status: DC
  Filled 2021-10-30: qty 180, 90d supply, fill #0

## 2021-10-30 MED ORDER — LEVOTHYROXINE SODIUM 88 MCG PO TABS
88.0000 ug | ORAL_TABLET | Freq: Every day | ORAL | 3 refills | Status: DC
  Filled 2021-10-30: qty 90, 90d supply, fill #0

## 2021-10-30 NOTE — Patient Instructions (Addendum)
I will check labs, keep up the good work with diet and exercise.

## 2021-10-30 NOTE — Progress Notes (Signed)
Subjective:  Patient ID: Jenna Carroll, female    DOB: May 04, 1961  Age: 61 y.o. MRN: [id number]  CC:  Chief Complaint  Patient presents with   Diabetes   Hypothyroidism   Immunizations    Shingrix due     HPI Jenna Carroll presents for   Diabetes: With hyperglycemia. Intolerant to higher doses of metformin.  Has tolerated 850 mg in the morning, 500 mg at night.  Increased exercise at her last visit in March. If elevated A1c, option of 850 mg twice daily dosing of metformin or other agent.  Currently taking '850mg'$  BID. No diarrhea or side effects at this dose.  No major diet/exercise change - still exercising. More active.  Home readings fasting under 150.  Home readings postprandial around 200.  Symptomatic lows - none  Microalbumin: Normal ratio 05/29/2021. Optho appointment in February, my eye doctor. Looked ok.   No hx of pancreatitis hx, no FH MEN syndrome or medullary thyroid CA.  Shingrix today. Potential side effects.   Lab Results  Component Value Date   HGBA1C 8.1 (H) 07/24/2021   HGBA1C 7.7 (A) 04/24/2021   HGBA1C 8.0 (H) 01/07/2021   Lab Results  Component Value Date   MICROALBUR <0.7 05/29/2021   LDLCALC 132 (H) 05/29/2021   CREATININE 0.87 05/29/2021   Hypothyroidism: Lab Results  Component Value Date   TSH 14.17 (H) 07/24/2021  Taking medication daily.  Higher dosing at 88 mcg Synthroid discussed last visit, increased from 75 mcg. Taking 76mg dose. Doing ok on that dose. Still some cold intolerance. No new hair or skin changes - less hair loss. Less heart palpitations.  History Patient Active Problem List   Diagnosis Date Noted   Mixed hyperlipidemia 04/24/2021   Hormone replacement therapy 04/04/2021   Postmenopause 04/04/2021   Encounter for screening fecal occult blood testing 04/04/2021   Type 2 diabetes mellitus without complication, without long-term current use of insulin ([facility] 04/04/2021   Encounter for colorectal cancer screening  037/16/9678  Lichen sclerosus et atrophicus 10/25/2019   Screening for colorectal cancer 02/24/2018   Encounter for well woman exam with routine gynecological exam 02/24/2018   Encounter for gynecological examination with Papanicolaou smear of cervix 04/08/2016   IUD (intrauterine device) in place 04/08/2016   Diabetes mellitus type 2, diet-controlled ([facility] 09/29/2012   Microalbuminuria 09/29/2012   UTI (urinary tract infection) 09/29/2012   Skin moles 09/29/2012   Hypothyroidism 03/16/2012   Arthritis 03/16/2012   Vitamin D deficiency 03/16/2012   Past Medical History:  Diagnosis Date   Arthritis    DM (diabetes mellitus) ([facility] 02/17/2012   Hyperlipidemia, mild    Hypothyroidism    Thyroid disease    Past Surgical History:  Procedure Laterality Date   left ankle     TONSILLECTOMY AND ADENOIDECTOMY     WISDOM TOOTH EXTRACTION     No Known Allergies Prior to Admission medications   Medication Sig Start Date End Date Taking? Authorizing Provider  Cholecalciferol (VITAMIN D-3) 125 MCG (5000 UT) TABS Take 1 tablet by mouth daily.   Yes [provider]  clobetasol ointment (TEMOVATE) 09.38% Apply 1 application topically 2 (two) times daily. 04/04/21  Yes GDerrek MonacoA, NP  Coenzyme Q10 (COQ10 PO) Take 300 mg by mouth daily.   Yes [provider]  Cyanocobalamin (VITAMIN B-12 PO) Take 5,000 mcg by mouth daily.   Yes [provider]  estradiol (ESTRACE) 1 MG tablet Take 1 tablet (1 mg total) by  mouth daily. 04/04/21 04/04/22 Yes Estill Dooms, NP  levothyroxine (SYNTHROID) 88 MCG tablet Take 1 tablet (88 mcg total) by mouth daily. 07/24/21  Yes Wendie Agreste, MD  MAGNESIUM GLUCONATE PO Take 300 mg by mouth daily.   Yes [provider]  metFORMIN (GLUCOPHAGE) 850 MG tablet Take 1 tablet (850 mg total) by mouth 2 (two) times daily with a meal. 05/22/21  Yes Wendie Agreste, MD  Naproxen Sodium (ALEVE PO) Take by mouth as needed.   Yes  [provider]  progesterone (PROMETRIUM) 200 MG capsule TAKE 1 CAPSULE BY MOUTH ONCE A DAY 04/04/21 04/04/22 Yes Estill Dooms, NP   Social History   Socioeconomic History   Marital status: Married    Spouse name: Not on file   Number of children: Not on file   Years of education: Not on file   Highest education level: Not on file  Occupational History   Not on file  Tobacco Use   Smoking status: Never   Smokeless tobacco: Never  Vaping Use   Vaping Use: Never used  Substance and Sexual Activity   Alcohol use: Yes    Comment: occ   Drug use: No   Sexual activity: Yes    Birth control/protection: Post-menopausal  Other Topics Concern   Not on file  Social History Narrative   Married 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   [place] Strain: Unknown (04/04/2021)   Overall Financial Resource Strain (CARDIA)    Difficulty of Paying Living Expenses: Patient refused  Food Insecurity: No Food Insecurity (04/04/2021)   Hunger Vital Sign    Worried About Running Out of Food in the Last Year: Never true    Ran Out of Food in the Last Year: Never true  Transportation Needs: No Transportation Needs (04/04/2021)   PRAPARE - Hydrologist (Medical): No    Lack of Transportation (Non-Medical): No  Physical Activity: Sufficiently Active (04/04/2021)   Exercise Vital Sign    Days of Exercise per Week: 5 days    Minutes of Exercise per Session: 30 min  Stress: Stress Concern Present (04/04/2021)   [place]    Feeling of Stress : To some extent  Social Connections: Socially Integrated (04/04/2021)   Social Connection and Isolation Panel [NHANES]    Frequency of Communication with Friends and Family: Once a week    Frequency of Social Gatherings with Friends and Family: Twice a week    Attends Religious Services: More  than 4 times per year    Active Member of [employer] or Organizations: Yes    Attends Music therapist: More than 4 times per year    Marital Status: Married  Human resources officer Violence: Not At Risk (04/04/2021)   Humiliation, Afraid, Rape, and Kick questionnaire    Fear of Current or Ex-Partner: No    Emotionally Abused: No    Physically Abused: No    Sexually Abused: No    Review of Systems  Constitutional:  Negative for fatigue and unexpected weight change.  Respiratory:  Negative for chest tightness and shortness of breath.   Cardiovascular:  Negative for chest pain, palpitations and leg swelling.  Gastrointestinal:  Negative for abdominal pain and blood in stool.  Neurological:  Negative for dizziness, syncope, light-headedness and headaches.     Objective:   Vitals:   10/30/21 1559  BP:  126/72  Pulse: 62  Resp: 16  Temp: 98.2 F (36.8 C)  TempSrc: Temporal  SpO2: 96%  Weight: 171 lb 12.8 oz (77.9 kg)  Height: '5\' 7"'$  (1.702 m)     Physical Exam Vitals reviewed.  Constitutional:      Appearance: Normal appearance. She is well-developed.  HENT:     Head: Normocephalic and atraumatic.  Eyes:     Conjunctiva/sclera: Conjunctivae normal.     Pupils: Pupils are equal, round, and reactive to light.  Neck:     Vascular: No carotid bruit.  Cardiovascular:     Rate and Rhythm: Normal rate and regular rhythm.     Heart sounds: Normal heart sounds. No murmur heard. Pulmonary:     Effort: Pulmonary effort is normal.     Breath sounds: Normal breath sounds.  Abdominal:     Palpations: Abdomen is soft. There is no pulsatile mass.     Tenderness: There is no abdominal tenderness.  Musculoskeletal:     Right lower leg: No edema.     Left lower leg: No edema.  Skin:    General: Skin is warm and dry.  Neurological:     Mental Status: She is alert and oriented to person, place, and time.  Psychiatric:        Mood and Affect: Mood normal.        Behavior:  Behavior normal.        Assessment & Plan:  Jenna Carroll is a 61 y.o. female . Hypothyroidism, unspecified type - Plan: levothyroxine (SYNTHROID) 88 MCG tablet, Comprehensive metabolic panel, TSH, CANCELED: TSH, CANCELED: Comprehensive metabolic panel  -Symptomatically improved at higher dose of Synthroid, repeat TSH to see if further adjustment is needed.  Lab visit tomorrow  Type 2 diabetes mellitus with hyperglycemia, without long-term current use of insulin ([place]) - Plan: metFORMIN (GLUCOPHAGE) 850 MG tablet, Comprehensive metabolic panel, Hemoglobin A1c, CANCELED: Hemoglobin A1c, CANCELED: Comprehensive metabolic panel  -Improved readings, still elevated postprandials, fasting.  Likely will need additional medication but is tolerating 850 mg metformin twice daily for now.  Commended on diet/exercise.  Consider DPP 4 versus SGLT2 versus GLP-1 depending on A1c level.  Potential side effects and risks of these classes were discussed.  -Discuss statin with previous hyperlipidemia.  Even intermittent dosing may be helpful.  She will think about this and can start meds if she is ready.  Low-dose statin initially intermittently may be reasonable with repeat testing 6 weeks into dosing.  Need for shingles vaccine - Plan: Varicella-zoster vaccine IM given.   Meds ordered this encounter  Medications   metFORMIN (GLUCOPHAGE) 850 MG tablet    Sig: Take 1 tablet (850 mg total) by mouth 2 (two) times daily with a meal.    Dispense:  180 tablet    Refill:  1   levothyroxine (SYNTHROID) 88 MCG tablet    Sig: Take 1 tablet (88 mcg total) by mouth daily.    Dispense:  90 tablet    Refill:  3   Patient Instructions  I will check labs, keep up the good work with diet and exercise.     Signed,   Merri Ray, MD [place] 10/30/21 4:58 PM

## 2021-10-31 ENCOUNTER — Other Ambulatory Visit (INDEPENDENT_AMBULATORY_CARE_PROVIDER_SITE_OTHER): Payer: 59

## 2021-10-31 DIAGNOSIS — E039 Hypothyroidism, unspecified: Secondary | ICD-10-CM | POA: Diagnosis not present

## 2021-10-31 DIAGNOSIS — E1165 Type 2 diabetes mellitus with hyperglycemia: Secondary | ICD-10-CM | POA: Diagnosis not present

## 2021-10-31 LAB — COMPREHENSIVE METABOLIC PANEL
ALT: 10 U/L (ref 0–35)
AST: 15 U/L (ref 0–37)
Albumin: 4.4 g/dL (ref 3.5–5.2)
Alkaline Phosphatase: 58 U/L (ref 39–117)
BUN: 23 mg/dL (ref 6–23)
CO2: 26 mEq/L (ref 19–32)
Calcium: 9.3 mg/dL (ref 8.4–10.5)
Chloride: 102 mEq/L (ref 96–112)
Creatinine, Ser: 1.05 mg/dL (ref 0.40–1.20)
GFR: 57.92 mL/min — ABNORMAL LOW (ref >60.00)
Glucose, Bld: 143 mg/dL — ABNORMAL HIGH (ref 70–99)
Potassium: 4 mEq/L (ref 3.5–5.1)
Sodium: 137 mEq/L (ref 135–145)
Total Bilirubin: 1 mg/dL (ref 0.2–1.2)
Total Protein: 7.5 g/dL (ref 6.0–8.3)

## 2021-10-31 LAB — TSH: TSH: 11.33 u[IU]/mL — ABNORMAL HIGH (ref 0.35–5.50)

## 2021-10-31 LAB — HEMOGLOBIN A1C: Hgb A1c MFr Bld: 8 % — ABNORMAL HIGH (ref 4.6–6.5)

## 2021-11-02 ENCOUNTER — Other Ambulatory Visit (HOSPITAL_COMMUNITY): Payer: Self-pay

## 2021-11-04 ENCOUNTER — Encounter: Payer: Self-pay | Admitting: Family Medicine

## 2021-11-04 NOTE — Telephone Encounter (Signed)
Pt is looking for lab results from 10/31/21 she notes she is willing to make changes for A1c control if recommended

## 2021-11-05 ENCOUNTER — Other Ambulatory Visit (HOSPITAL_COMMUNITY): Payer: Self-pay

## 2021-11-05 ENCOUNTER — Other Ambulatory Visit: Payer: Self-pay | Admitting: Family Medicine

## 2021-11-05 DIAGNOSIS — E039 Hypothyroidism, unspecified: Secondary | ICD-10-CM

## 2021-11-05 DIAGNOSIS — E1165 Type 2 diabetes mellitus with hyperglycemia: Secondary | ICD-10-CM

## 2021-11-05 MED ORDER — LEVOTHYROXINE SODIUM 100 MCG PO TABS
100.0000 ug | ORAL_TABLET | Freq: Every day | ORAL | 1 refills | Status: DC
  Filled 2021-11-05: qty 90, 90d supply, fill #0
  Filled 2022-01-30 (×2): qty 90, 90d supply, fill #1

## 2021-11-05 MED ORDER — SITAGLIPTIN PHOSPHATE 100 MG PO TABS
100.0000 mg | ORAL_TABLET | Freq: Every day | ORAL | 1 refills | Status: DC
  Filled 2021-11-05: qty 30, 30d supply, fill #0
  Filled 2021-11-30: qty 30, 30d supply, fill #1
  Filled 2021-12-30: qty 30, 30d supply, fill #2
  Filled 2022-01-30: qty 30, 30d supply, fill #3

## 2021-11-05 NOTE — Telephone Encounter (Signed)
Replied by lab results.

## 2021-11-05 NOTE — Progress Notes (Signed)
See lab notes

## 2021-11-20 ENCOUNTER — Other Ambulatory Visit (HOSPITAL_COMMUNITY): Payer: Self-pay

## 2021-12-02 ENCOUNTER — Other Ambulatory Visit (HOSPITAL_COMMUNITY): Payer: Self-pay

## 2021-12-05 ENCOUNTER — Other Ambulatory Visit (HOSPITAL_COMMUNITY): Payer: Self-pay

## 2021-12-18 ENCOUNTER — Encounter: Payer: Self-pay | Admitting: Family Medicine

## 2021-12-18 ENCOUNTER — Ambulatory Visit: Payer: 59 | Admitting: Family Medicine

## 2021-12-18 VITALS — BP 124/76 | HR 72 | Temp 98.1°F | Resp 20 | Ht 67.0 in | Wt 171.6 lb

## 2021-12-18 DIAGNOSIS — E039 Hypothyroidism, unspecified: Secondary | ICD-10-CM

## 2021-12-18 DIAGNOSIS — E1165 Type 2 diabetes mellitus with hyperglycemia: Secondary | ICD-10-CM | POA: Diagnosis not present

## 2021-12-18 LAB — TSH: TSH: 5.44 u[IU]/mL (ref 0.35–5.50)

## 2021-12-18 NOTE — Progress Notes (Signed)
Subjective:  Patient ID: Jenna Carroll, female    DOB: 1961/01/27  Age: 61 y.o. MRN: [id number]  CC:  Chief Complaint  Patient presents with   Follow-up    F/u meds    HPI GENAVIVE KUBICKI presents for   Hypothyroidism: Lab Results  Component Value Date   TSH 11.33 (H) 10/31/2021  Uncontrolled on recent labs, Synthroid increased to 100 mcg daily on June 20. Feeling better on higher dose synthroid.  Taking medication daily.  No new hot or cold intolerance. No new hair or skin changes, heart palpitations or new fatigue. No new weight changes. Less cold feeling and less hair loss.    Diabetes: Complicated by hyperglycemia. Metformin has been increased up to 850 mg twice daily.  Still elevated A1c of 8.0 on June 15.  Statins have been recommended.  Januvia 100 mg started on June 20. Minimal diarrhea at times. Not daily, and improving.  No abdominal pain Microalbumin: Normal ratio 05/29/2021 Optho, foot exam, pneumovax: Up-to-date Home readings:  Fasting 118 today - typical fasting readings. Postprandial - rare.   Lab Results  Component Value Date   HGBA1C 8.0 (H) 10/31/2021   HGBA1C 8.1 (H) 07/24/2021   HGBA1C 7.7 (A) 04/24/2021   Lab Results  Component Value Date   MICROALBUR <0.7 05/29/2021   [place] 132 (H) 05/29/2021   CREATININE 1.05 10/31/2021     History Patient Active Problem List   Diagnosis Date Noted   Mixed hyperlipidemia 04/24/2021   Hormone replacement therapy 04/04/2021   Postmenopause 04/04/2021   Encounter for screening fecal occult blood testing 04/04/2021   Type 2 diabetes mellitus without complication, without long-term current use of insulin ([place]) 04/04/2021   Encounter for colorectal cancer screening 12/75/1700   Lichen sclerosus et atrophicus 10/25/2019   Screening for colorectal cancer 02/24/2018   Encounter for well woman exam with routine gynecological exam 02/24/2018   Encounter for gynecological examination with Papanicolaou smear of  cervix 04/08/2016   IUD (intrauterine device) in place 04/08/2016   Diabetes mellitus type 2, diet-controlled ([place]) 09/29/2012   Microalbuminuria 09/29/2012   UTI (urinary tract infection) 09/29/2012   Skin moles 09/29/2012   Hypothyroidism 03/16/2012   Arthritis 03/16/2012   Vitamin D deficiency 03/16/2012   Past Medical History:  Diagnosis Date   Arthritis    DM (diabetes mellitus) ([place]) 02/17/2012   Hyperlipidemia, mild    Hypothyroidism    Thyroid disease    Past Surgical History:  Procedure Laterality Date   left ankle     TONSILLECTOMY AND ADENOIDECTOMY     WISDOM TOOTH EXTRACTION     No Known Allergies Prior to Admission medications   Medication Sig Start Date End Date Taking? Authorizing Provider  Cholecalciferol (VITAMIN D-3) 125 MCG (5000 UT) TABS Take 1 tablet by mouth daily.   Yes [provider]  clobetasol ointment (TEMOVATE) 0.05 % Apply 1 topically 2 (two) times daily. 04/04/21  Yes Derrek Monaco A, NP  Coenzyme Q10 (COQ10 PO) Take 300 mg by mouth daily.   Yes [provider]  Cyanocobalamin (VITAMIN B-12 PO) Take 5,000 mcg by mouth daily.   Yes [provider]  estradiol (ESTRACE) 1 MG tablet Take 1 tablet (1 mg total) by mouth daily. 04/04/21 04/04/22 Yes Estill Dooms, NP  levothyroxine (SYNTHROID) 100 MCG tablet Take 1 tablet (100 mcg total) by mouth daily. 11/05/21  Yes Wendie Agreste, MD  MAGNESIUM GLUCONATE PO Take 300 mg by mouth daily.   Yes  [provider]  metFORMIN (GLUCOPHAGE) 850 MG tablet Take 1 tablet (850 mg total) by mouth 2 (two) times daily with a meal. 10/30/21  Yes Wendie Agreste, MD  Naproxen Sodium (ALEVE PO) Take by mouth as needed.   Yes [provider]  progesterone (PROMETRIUM) 200 MG capsule TAKE 1 CAPSULE BY MOUTH ONCE A DAY 04/04/21 04/04/22 Yes Derrek Monaco A, NP  sitaGLIPtin (JANUVIA) 100 MG tablet Take 1 tablet (100 mg total) by mouth daily. 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   [place] Strain: Unknown (04/04/2021)   Overall Financial Resource Strain (CARDIA)    Difficulty of Paying Living Expenses: Patient refused  Food Insecurity: No Food Insecurity (04/04/2021)   Hunger Vital Sign    Worried About Running Out of Food in the Last Year: Never true    Ran Out of Food in the Last Year: Never true  Transportation Needs: No Transportation Needs (04/04/2021)   PRAPARE - Hydrologist (Medical): No    Lack of Transportation (Non-Medical): No  Physical Activity: Sufficiently Active (04/04/2021)   Exercise Vital Sign    Days of Exercise per Week: 5 days    Minutes of Exercise per Session: 30 min  Stress: Stress Concern Present (04/04/2021)   [place]    Feeling of Stress : To some extent  Social Connections: Socially Integrated (04/04/2021)   Social Connection and Isolation Panel [NHANES]    Frequency of Communication with Friends and Family: Once a week    Frequency of Social Gatherings with Friends and Family: Twice a week    Attends Religious Services: More than 4 times per year    Active Member of [employer] or Organizations: Yes    Attends Music therapist: More than 4 times per  year    Marital Status: Married  Human resources officer Violence: Not At Risk (04/04/2021)   Humiliation, Afraid, Rape, and Kick questionnaire    Fear of Current or Ex-Partner: No    Emotionally Abused: No    Physically Abused: No    Sexually Abused: No    Review of Systems Per HPI  Objective:   Vitals:   12/18/21 0928  BP: 124/76  Pulse: 72  Resp: 20  Temp: 98.1 F (36.7 C)  SpO2: 96%  Weight: 171 lb 9.6 oz (77.8 kg)  Height: '5\' 7"'$  (1.702 m)     Physical Exam Vitals reviewed.  Constitutional:      Appearance: Normal appearance. She is well-developed.  HENT:     Head: Normocephalic and atraumatic.  Eyes:     Conjunctiva/sclera: Conjunctivae normal.     Pupils: Pupils are  equal, round, and reactive to light.  Neck:     Vascular: No carotid bruit.  Cardiovascular:     Rate and Rhythm: Normal rate and regular rhythm.     Heart sounds: Normal heart sounds.  Pulmonary:     Effort: Pulmonary effort is normal.     Breath sounds: Normal breath sounds.  Abdominal:     Palpations: Abdomen is soft. There is no pulsatile mass.     Tenderness: There is no abdominal tenderness.  Musculoskeletal:     Right lower leg: No edema.     Left lower leg: No edema.  Skin:    General: Skin is warm and dry.  Neurological:     Mental Status: She is alert and oriented to person, place, and time.  Psychiatric:        Mood and Affect: Mood normal.        Behavior: Behavior normal.        Assessment & Plan:  LYNDA WANNINGER is a 61 y.o. female . Hypothyroidism, unspecified type - Plan: TSH  -Symptomatically improved at higher dose Synthroid, check TSH.  Type 2 diabetes mellitus with hyperglycemia, without long-term current use of insulin ([facility]) - Plan: Fructosamine  -Check fructosamine, anticipate improvement with Januvia, continue metformin for now but option of lower dosing or change in regimen if persistent or worsening gastrointestinal side effects.  A1c planned at September visit.   RTC precautions.  Plans on second Shingrix when she can schedule day off afterwards as some side effects after initial injection.  No orders of the defined types were placed in this encounter.  Patient Instructions  Thanks for coming in today.  I will let you know about the updated labs within the next week.  If you continue to have any diarrhea or stomach upset I would consider lowering the dose of metformin and adjusting medications otherwise.  Thanks for coming in today and please let me know if there are questions.    Signed,   Merri Ray, MD [place] 12/18/21 9:58 AM

## 2021-12-18 NOTE — Patient Instructions (Signed)
Thanks for coming in today.  I will let you know about the updated labs within the next week.  If you continue to have any diarrhea or stomach upset I would consider lowering the dose of metformin and adjusting medications otherwise.  Thanks for coming in today and please let me know if there are questions.

## 2021-12-24 LAB — FRUCTOSAMINE: Fructosamine: 345 umol/L — ABNORMAL HIGH (ref 205–285)

## 2021-12-30 ENCOUNTER — Other Ambulatory Visit (HOSPITAL_COMMUNITY): Payer: Self-pay

## 2022-01-29 ENCOUNTER — Ambulatory Visit: Payer: 59 | Admitting: Family Medicine

## 2022-01-30 ENCOUNTER — Other Ambulatory Visit (HOSPITAL_COMMUNITY): Payer: Self-pay

## 2022-02-04 ENCOUNTER — Other Ambulatory Visit (HOSPITAL_COMMUNITY): Payer: Self-pay

## 2022-02-05 ENCOUNTER — Other Ambulatory Visit (HOSPITAL_COMMUNITY): Payer: Self-pay

## 2022-02-05 ENCOUNTER — Encounter: Payer: Self-pay | Admitting: Family Medicine

## 2022-02-05 ENCOUNTER — Ambulatory Visit: Payer: 59 | Admitting: Family Medicine

## 2022-02-05 VITALS — BP 120/76 | HR 61 | Temp 98.2°F | Ht 67.0 in | Wt 170.0 lb

## 2022-02-05 DIAGNOSIS — E1165 Type 2 diabetes mellitus with hyperglycemia: Secondary | ICD-10-CM

## 2022-02-05 DIAGNOSIS — E039 Hypothyroidism, unspecified: Secondary | ICD-10-CM | POA: Diagnosis not present

## 2022-02-05 DIAGNOSIS — Z23 Encounter for immunization: Secondary | ICD-10-CM

## 2022-02-05 LAB — HEMOGLOBIN A1C: Hgb A1c MFr Bld: 7.1 % — ABNORMAL HIGH (ref 4.6–6.5)

## 2022-02-05 LAB — TSH: TSH: 2.62 u[IU]/mL (ref 0.35–5.50)

## 2022-02-05 MED ORDER — SITAGLIPTIN PHOSPHATE 100 MG PO TABS
100.0000 mg | ORAL_TABLET | Freq: Every day | ORAL | 1 refills | Status: DC
  Filled 2022-02-05 – 2022-02-11 (×2): qty 90, 90d supply, fill #0
  Filled 2022-02-28: qty 30, 30d supply, fill #0
  Filled 2022-03-28: qty 30, 30d supply, fill #1
  Filled 2022-04-18 – 2022-05-01 (×3): qty 30, 30d supply, fill #2

## 2022-02-05 MED ORDER — METFORMIN HCL 850 MG PO TABS
850.0000 mg | ORAL_TABLET | Freq: Two times a day (BID) | ORAL | 1 refills | Status: DC
  Filled 2022-02-05: qty 180, 90d supply, fill #0

## 2022-02-05 MED ORDER — LEVOTHYROXINE SODIUM 100 MCG PO TABS
100.0000 ug | ORAL_TABLET | Freq: Every day | ORAL | 1 refills | Status: DC
  Filled 2022-02-05: qty 90, 90d supply, fill #0
  Filled 2022-02-11: qty 90, 90d supply, fill #1

## 2022-02-05 NOTE — Progress Notes (Signed)
Subjective:  Patient ID: Jenna Carroll, female    DOB: [birth year]  Age: 61 y.o. MRN: [id number]  CC:  Chief Complaint  Patient presents with   Hypothyroidism   Diabetes    Pt states all is well    HPI Jenna Carroll presents for   Hypothyroidism: Lab Results  Component Value Date   TSH 5.44 12/18/2021  Taking medication daily.  Synthroid dose was increased earlier this year.  Currently taking 100 mcg daily. Last dose this am.  No new hot or cold intolerance. No new hair or skin changes, heart palpitations or new fatigue. No new weight changes.   Diabetes: Complicated by hyperglycemia.  Metformin previously increased to 850 mg twice daily.  Januvia started in June.  Minimal diarrhea discussed last visit.  Pretesting slightly elevated in August. GI issues better - tolerating doses.  Home readings: fasting 144 today. Usually 110-120's.  Postprandial 170-220.  Feels off this week. Some nausea, dizziness this week. Viral syndrome at work, feels better today - feels fine today.  Plan on bunion surgery soon.  Watching diet.  Will be looking at possible job change, vs. retirement or time off.   Results for orders placed or performed in visit on 12/18/21  TSH  Result Value Ref Range   TSH 5.44 0.35 - 5.50 uIU/mL  Fructosamine  Result Value Ref Range   Fructosamine 345 (H) 205 - 285 umol/L  Microalbumin: Normal ratio 05/29/2021. Optho, foot exam, pneumovax: up to date  Lab Results  Component Value Date   HGBA1C 8.0 (H) 10/31/2021   HGBA1C 8.1 (H) 07/24/2021   HGBA1C 7.7 (A) 04/24/2021   Lab Results  Component Value Date   MICROALBUR <0.7 05/29/2021   [place] 132 (H) 05/29/2021   CREATININE 1.05 10/31/2021    History Patient Active Problem List   Diagnosis Date Noted   Mixed hyperlipidemia 04/24/2021   Hormone replacement therapy 04/04/2021   Postmenopause 04/04/2021   Encounter for screening fecal occult blood testing 04/04/2021   Type 2 diabetes mellitus without  complication, without long-term current use of insulin ([place]) 04/04/2021   Encounter for colorectal cancer screening 77/41/2878   Lichen sclerosus et atrophicus 10/25/2019   Screening for colorectal cancer 02/24/2018   Encounter for well woman exam with routine gynecological exam 02/24/2018   Encounter for gynecological examination with Papanicolaou smear of cervix 04/08/2016   IUD (intrauterine device) in place 04/08/2016   Diabetes mellitus type 2, diet-controlled ([place]) 09/29/2012   Microalbuminuria 09/29/2012   UTI (urinary tract infection) 09/29/2012   Skin moles 09/29/2012   Hypothyroidism 03/16/2012   Arthritis 03/16/2012   Vitamin D deficiency 03/16/2012   Past Medical History:  Diagnosis Date   Arthritis    DM (diabetes mellitus) ([place]) 02/17/2012   Hyperlipidemia, mild    Hypothyroidism    Thyroid disease    Past Surgical History:  Procedure Laterality Date   left ankle     TONSILLECTOMY AND ADENOIDECTOMY     WISDOM TOOTH EXTRACTION     No Known Allergies Prior to Admission medications   Medication Sig Start Date End Date Taking? Authorizing Provider  Cholecalciferol (VITAMIN D-3) 125 MCG (5000 UT) TABS Take 1 tablet by mouth daily.   Yes [provider]  clobetasol ointment (TEMOVATE) 0.05 % Apply 1 topically 2 (two) times daily. 04/04/21  Yes Derrek Monaco A, NP  Coenzyme Q10 (COQ10 PO) Take 300 mg by mouth daily.   Yes [provider]  Cyanocobalamin (VITAMIN B-12 PO) Take  5,000 mcg by mouth daily.   Yes [provider]  estradiol (ESTRACE) 1 MG tablet Take 1 tablet (1 mg total) by mouth daily. 04/04/21 04/04/22 Yes Estill Dooms, NP  levothyroxine (SYNTHROID) 100 MCG tablet Take 1 tablet (100 mcg total) by mouth daily. 11/05/21  Yes Wendie Agreste, MD  MAGNESIUM GLUCONATE PO Take 300 mg by mouth daily.   Yes [provider]  metFORMIN (GLUCOPHAGE) 850 MG tablet Take 1 tablet (850 mg total) by mouth 2 (two) times daily  with a meal. 10/30/21  Yes Wendie Agreste, MD  progesterone (PROMETRIUM) 200 MG capsule TAKE 1 CAPSULE BY MOUTH ONCE A DAY 04/04/21 04/04/22 Yes Derrek Monaco A, NP  sitaGLIPtin (JANUVIA) 100 MG tablet Take 1 tablet (100 mg total) by mouth daily. 11/05/21  Yes Wendie Agreste, MD  Naproxen Sodium (ALEVE PO) Take by mouth as needed. Patient not taking: Reported on 02/05/2022    [provider]   Social History   Socioeconomic History   Marital status: Married    Spouse name: Not on file   Number of children: Not on file   Years of education: Not on file   Highest education level: Not on file  Occupational History   Not on file  Tobacco Use   Smoking status: Never   Smokeless tobacco: Never  Vaping Use   Vaping Use: Never used  Substance and Sexual Activity   Alcohol use: Yes    Comment: occ   Drug use: No   Sexual activity: Yes    Birth control/protection: Post-menopausal  Other Topics Concern   Not on file  Social History Narrative   Married 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   [place] Strain: Unknown (04/04/2021)   Overall Financial Resource Strain (CARDIA)    Difficulty of Paying Living Expenses: Patient refused  Food Insecurity: No Food Insecurity (04/04/2021)   Hunger Vital Sign    Worried About Running Out of Food in the Last Year: Never true    Ran Out of Food in the Last Year: Never true  Transportation Needs: No Transportation Needs (04/04/2021)   PRAPARE - Hydrologist (Medical): No    Lack of Transportation (Non-Medical): No  Physical Activity: Sufficiently Active (04/04/2021)   Exercise Vital Sign    Days of Exercise per Week: 5 days    Minutes of Exercise per Session: 30 min  Stress: Stress Concern Present (04/04/2021)   Jenna Carroll    Feeling of Stress : To some extent  Social  Connections: Socially Integrated (04/04/2021)   Social Connection and Isolation Panel [NHANES]    Frequency of Communication with Friends and Family: Once a week    Frequency of Social Gatherings with Friends and Family: Twice a week    Attends Religious Services: More than 4 times per year    Active Member of [employer] or Organizations: Yes    Attends Archivist Meetings: More than 4 times per year    Marital Status: Married  Human resources officer Violence: Not At Risk (04/04/2021)   Humiliation, Afraid, Rape, and Kick questionnaire    Fear of Current or Ex-Partner: No    Emotionally Abused: No    Physically Abused: No    Sexually Abused: No    Review of Systems Per HPI.   Objective:   Vitals:   02/05/22 0913  BP: 120/76  Pulse: 61  Temp: 98.2 F (36.8 C)  SpO2: 100%  Weight: 170 lb (77.1 kg)  Height: '5\' 7"'$  (1.702 m)     Physical Exam Vitals reviewed.  Constitutional:      Appearance: Normal appearance. She is well-developed.  HENT:     Head: Normocephalic and atraumatic.  Eyes:     Conjunctiva/sclera: Conjunctivae normal.     Pupils: Pupils are equal, round, and reactive to light.  Neck:     Vascular: No carotid bruit.  Cardiovascular:     Rate and Rhythm: Normal rate and regular rhythm.     Heart sounds: Normal heart sounds.  Pulmonary:     Effort: Pulmonary effort is normal.     Breath sounds: Normal breath sounds.  Abdominal:     Palpations: Abdomen is soft. There is no pulsatile mass.     Tenderness: There is no abdominal tenderness.  Musculoskeletal:     Right lower leg: No edema.     Left lower leg: No edema.  Skin:    General: Skin is warm and dry.  Neurological:     Mental Status: She is alert and oriented to person, place, and time.  Psychiatric:        Mood and Affect: Mood normal.        Behavior: Behavior normal.        Assessment & Plan:  JOSIEPHINE SIMAO is a 61 y.o. female . Type 2 diabetes mellitus with hyperglycemia, without  long-term current use of insulin ([facility]) - Plan: sitaGLIPtin (JANUVIA) 100 MG tablet, metFORMIN (GLUCOPHAGE) 850 MG tablet, Hemoglobin A1c  -  Stable, tolerating current regimen. Medications refilled. Labs pending as above.  Medication adjustment accordingly based on results.  Consider adding SGLT2.  Potential side effects and risks were discussed.  Need for influenza vaccination - Plan: Flu Vaccine QUAD 6+ mos PF IM (Fluarix Quad PF)  Need for shingles vaccine - Plan: Varicella-zoster vaccine IM  Hypothyroidism, unspecified type - Plan: levothyroxine (SYNTHROID) 100 MCG tablet, TSH  -Check labs with medication adjustment accordingly.  Consider higher dose if elevated TSH or high normal.  Meds ordered this encounter  Medications   levothyroxine (SYNTHROID) 100 MCG tablet    Sig: Take 1 tablet (100 mcg total) by mouth daily.    Dispense:  90 tablet    Refill:  1   sitaGLIPtin (JANUVIA) 100 MG tablet    Sig: Take 1 tablet (100 mg total) by mouth daily.    Dispense:  90 tablet    Refill:  1   metFORMIN (GLUCOPHAGE) 850 MG tablet    Sig: Take 1 tablet (850 mg total) by mouth 2 (two) times daily with a meal.    Dispense:  180 tablet    Refill:  1   Patient Instructions  Depending on A1c we could consider adding Jardiance or [place].  Continue metformin and Januvia for now.  If any concerns on labs I will let you know.  Take care.     Signed,   Merri Ray, MD [place] 02/05/22 9:20 AM

## 2022-02-05 NOTE — Patient Instructions (Signed)
Depending on A1c we could consider adding Jardiance or [place].  Continue metformin and Januvia for now.  If any concerns on labs I will let you know.  Take care.

## 2022-02-06 ENCOUNTER — Encounter: Payer: Self-pay | Admitting: Family Medicine

## 2022-02-06 ENCOUNTER — Other Ambulatory Visit (HOSPITAL_COMMUNITY): Payer: Self-pay

## 2022-02-07 ENCOUNTER — Other Ambulatory Visit (HOSPITAL_COMMUNITY): Payer: Self-pay

## 2022-02-11 ENCOUNTER — Other Ambulatory Visit: Payer: Self-pay | Admitting: Adult Health

## 2022-02-11 DIAGNOSIS — L859 Epidermal thickening, unspecified: Secondary | ICD-10-CM | POA: Diagnosis not present

## 2022-02-11 DIAGNOSIS — E119 Type 2 diabetes mellitus without complications: Secondary | ICD-10-CM | POA: Diagnosis not present

## 2022-02-11 DIAGNOSIS — M2012 Hallux valgus (acquired), left foot: Secondary | ICD-10-CM | POA: Diagnosis not present

## 2022-02-12 ENCOUNTER — Other Ambulatory Visit (HOSPITAL_COMMUNITY): Payer: Self-pay

## 2022-02-12 MED ORDER — PROGESTERONE 200 MG PO CAPS
ORAL_CAPSULE | Freq: Every day | ORAL | 2 refills | Status: DC
  Filled 2022-02-12: qty 90, fill #0
  Filled 2022-02-12: qty 90, 90d supply, fill #0
  Filled 2022-03-13: qty 90, 90d supply, fill #1

## 2022-02-28 ENCOUNTER — Other Ambulatory Visit (HOSPITAL_COMMUNITY): Payer: Self-pay

## 2022-03-01 ENCOUNTER — Other Ambulatory Visit (HOSPITAL_COMMUNITY): Payer: Self-pay

## 2022-03-11 ENCOUNTER — Encounter (HOSPITAL_COMMUNITY): Payer: Self-pay

## 2022-03-11 ENCOUNTER — Other Ambulatory Visit (HOSPITAL_COMMUNITY): Payer: Self-pay

## 2022-03-11 DIAGNOSIS — M79672 Pain in left foot: Secondary | ICD-10-CM | POA: Diagnosis not present

## 2022-03-11 DIAGNOSIS — M2012 Hallux valgus (acquired), left foot: Secondary | ICD-10-CM | POA: Diagnosis not present

## 2022-03-11 MED ORDER — HYDROCODONE-ACETAMINOPHEN 10-325 MG PO TABS
1.0000 | ORAL_TABLET | Freq: Four times a day (QID) | ORAL | 0 refills | Status: DC | PRN
  Filled 2022-03-11: qty 20, 5d supply, fill #0

## 2022-03-12 ENCOUNTER — Other Ambulatory Visit (HOSPITAL_COMMUNITY): Payer: Self-pay

## 2022-03-13 ENCOUNTER — Other Ambulatory Visit (HOSPITAL_COMMUNITY): Payer: Self-pay

## 2022-03-13 DIAGNOSIS — M19072 Primary osteoarthritis, left ankle and foot: Secondary | ICD-10-CM | POA: Diagnosis not present

## 2022-03-13 DIAGNOSIS — Z01818 Encounter for other preprocedural examination: Secondary | ICD-10-CM | POA: Diagnosis not present

## 2022-03-13 DIAGNOSIS — M21612 Bunion of left foot: Secondary | ICD-10-CM | POA: Diagnosis not present

## 2022-03-13 DIAGNOSIS — M2012 Hallux valgus (acquired), left foot: Secondary | ICD-10-CM | POA: Diagnosis not present

## 2022-03-13 DIAGNOSIS — M7732 Calcaneal spur, left foot: Secondary | ICD-10-CM | POA: Diagnosis not present

## 2022-03-13 MED ORDER — ONDANSETRON HCL 4 MG PO TABS
4.0000 mg | ORAL_TABLET | Freq: Three times a day (TID) | ORAL | 0 refills | Status: DC | PRN
  Filled 2022-03-13: qty 10, 2d supply, fill #0

## 2022-03-14 ENCOUNTER — Other Ambulatory Visit (HOSPITAL_COMMUNITY): Payer: Self-pay

## 2022-03-14 DIAGNOSIS — E039 Hypothyroidism, unspecified: Secondary | ICD-10-CM | POA: Diagnosis not present

## 2022-03-14 DIAGNOSIS — M7989 Other specified soft tissue disorders: Secondary | ICD-10-CM | POA: Diagnosis not present

## 2022-03-14 DIAGNOSIS — M21612 Bunion of left foot: Secondary | ICD-10-CM | POA: Diagnosis not present

## 2022-03-14 DIAGNOSIS — M2012 Hallux valgus (acquired), left foot: Secondary | ICD-10-CM | POA: Diagnosis not present

## 2022-03-14 DIAGNOSIS — Z79899 Other long term (current) drug therapy: Secondary | ICD-10-CM | POA: Diagnosis not present

## 2022-03-14 DIAGNOSIS — E119 Type 2 diabetes mellitus without complications: Secondary | ICD-10-CM | POA: Diagnosis not present

## 2022-03-14 DIAGNOSIS — M79672 Pain in left foot: Secondary | ICD-10-CM | POA: Diagnosis not present

## 2022-03-14 DIAGNOSIS — Z79890 Hormone replacement therapy (postmenopausal): Secondary | ICD-10-CM | POA: Diagnosis not present

## 2022-03-14 DIAGNOSIS — Z7984 Long term (current) use of oral hypoglycemic drugs: Secondary | ICD-10-CM | POA: Diagnosis not present

## 2022-03-14 DIAGNOSIS — M1990 Unspecified osteoarthritis, unspecified site: Secondary | ICD-10-CM | POA: Diagnosis not present

## 2022-03-15 ENCOUNTER — Other Ambulatory Visit (HOSPITAL_COMMUNITY): Payer: Self-pay

## 2022-03-17 ENCOUNTER — Other Ambulatory Visit (HOSPITAL_COMMUNITY): Payer: Self-pay

## 2022-03-18 DIAGNOSIS — Z4889 Encounter for other specified surgical aftercare: Secondary | ICD-10-CM | POA: Diagnosis not present

## 2022-03-28 ENCOUNTER — Other Ambulatory Visit (HOSPITAL_COMMUNITY): Payer: Self-pay

## 2022-04-01 DIAGNOSIS — Z4889 Encounter for other specified surgical aftercare: Secondary | ICD-10-CM | POA: Diagnosis not present

## 2022-04-08 ENCOUNTER — Other Ambulatory Visit (HOSPITAL_COMMUNITY): Payer: Self-pay

## 2022-04-14 DIAGNOSIS — Z760 Encounter for issue of repeat prescription: Secondary | ICD-10-CM | POA: Diagnosis not present

## 2022-04-15 DIAGNOSIS — Z4889 Encounter for other specified surgical aftercare: Secondary | ICD-10-CM | POA: Diagnosis not present

## 2022-04-18 ENCOUNTER — Other Ambulatory Visit (HOSPITAL_COMMUNITY): Payer: Self-pay

## 2022-04-29 DIAGNOSIS — Z4889 Encounter for other specified surgical aftercare: Secondary | ICD-10-CM | POA: Diagnosis not present

## 2022-05-01 ENCOUNTER — Other Ambulatory Visit (HOSPITAL_COMMUNITY): Payer: Self-pay

## 2022-05-05 ENCOUNTER — Other Ambulatory Visit (HOSPITAL_COMMUNITY): Payer: Self-pay

## 2022-05-05 ENCOUNTER — Encounter: Payer: Self-pay | Admitting: Obstetrics & Gynecology

## 2022-05-05 ENCOUNTER — Other Ambulatory Visit: Payer: Self-pay

## 2022-05-05 ENCOUNTER — Ambulatory Visit (INDEPENDENT_AMBULATORY_CARE_PROVIDER_SITE_OTHER): Payer: 59 | Admitting: Obstetrics & Gynecology

## 2022-05-05 VITALS — BP 137/80 | HR 69 | Ht 67.0 in | Wt 178.0 lb

## 2022-05-05 DIAGNOSIS — Z01419 Encounter for gynecological examination (general) (routine) without abnormal findings: Secondary | ICD-10-CM

## 2022-05-05 DIAGNOSIS — Z79890 Hormone replacement therapy (postmenopausal): Secondary | ICD-10-CM | POA: Diagnosis not present

## 2022-05-05 MED ORDER — ESTRADIOL 0.1 MG/GM VA CREA
1.0000 | TOPICAL_CREAM | VAGINAL | 12 refills | Status: DC
  Filled 2022-05-05: qty 42.5, 90d supply, fill #0
  Filled 2022-07-29: qty 42.5, 90d supply, fill #1

## 2022-05-05 MED ORDER — PROGESTERONE 200 MG PO CAPS
200.0000 mg | ORAL_CAPSULE | Freq: Every day | ORAL | 3 refills | Status: DC
  Filled 2022-05-05: qty 90, 90d supply, fill #0
  Filled 2022-07-29: qty 90, 90d supply, fill #1
  Filled 2022-10-27: qty 90, 90d supply, fill #2
  Filled 2023-01-25: qty 90, 90d supply, fill #3

## 2022-05-05 MED ORDER — ESTRADIOL 1 MG PO TABS
1.0000 mg | ORAL_TABLET | Freq: Every day | ORAL | 0 refills | Status: DC
  Filled 2022-05-05: qty 90, 90d supply, fill #0
  Filled 2022-07-29: qty 90, 90d supply, fill #1
  Filled 2022-10-27: qty 90, 90d supply, fill #2
  Filled 2023-01-25: qty 90, 90d supply, fill #3

## 2022-05-05 NOTE — Progress Notes (Signed)
Subjective:     Jenna Carroll is a 61 y.o. female here for a routine exam.  Patient's last menstrual period was 11/07/2015. G0P0000 Birth Control Method:  na. menopausal Menstrual Calendar(currently): amenorrheic  Current complaints:  none.   Current acute medical issues:     Recent Gynecologic History Patient's last menstrual period was 11/07/2015. Last Pap: 6/21,  normal Last mammogram: 2/23,  normal  Past Medical History:  Diagnosis Date   Arthritis    DM (diabetes mellitus) ([place]) 02/17/2012   Hyperlipidemia, mild    Hypothyroidism    Thyroid disease     Past Surgical History:  Procedure Laterality Date   left ankle     TONSILLECTOMY AND ADENOIDECTOMY     WISDOM TOOTH EXTRACTION      OB History     Gravida  0   Para  0   Term  0   Preterm  0   AB  0   Living  0      SAB  0   IAB  0   Ectopic  0   Multiple  0   Live Births  0           Social History   Socioeconomic History   Marital status: Married    Spouse name: Not on file   Number of children: Not on file   Years of education: Not on file   Highest education level: Not on file  Occupational History   Not on file  Tobacco Use   Smoking status: Never   Smokeless tobacco: Never  Vaping Use   Vaping Use: Never used  Substance and Sexual Activity   Alcohol use: Yes    Comment: occ   Drug use: No   Sexual activity: Yes    Birth control/protection: Post-menopausal  Other Topics Concern   Not on file  Social History Narrative   Married 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   Social Determinants of [employer]: [place]  (05/05/2022)   Overall Financial Resource Strain (CARDIA)    Difficulty of Paying Living Expenses: Not hard at all  Food Insecurity: No Food Insecurity (05/05/2022)   Hunger Vital Sign    Worried About Running Out of Food in the Last Year: Never true    [place] in the Last Year: Never true  Transportation Needs:  No Transportation Needs (05/05/2022)   PRAPARE - Hydrologist (Medical): No    Lack of Transportation (Non-Medical): No  Physical Activity: Sufficiently Active (05/05/2022)   Exercise Vital Sign    Days of Exercise per Week: 5 days    Minutes of Exercise per Session: 40 min  Stress: No Stress Concern Present (05/05/2022)   [place]    Feeling of Stress : Only a little  Social Connections: Socially Integrated (05/05/2022)   Social Connection and Isolation Panel [NHANES]    Frequency of Communication with Friends and Family: Three times a week    Frequency of Social Gatherings with Friends and Family: Three times a week    Attends Religious Services: More than 4 times per year    Active Member of Clubs or Organizations: Yes    Attends Archivist Meetings: More than 4 times per year    Marital Status: Married    Family History  Problem Relation Age of Onset   Kidney disease Mother  Diabetes Mother    Heart disease Mother    Hypertension Mother    Hyperlipidemia Mother    Osteoporosis Mother    Heart disease Father    Arthritis Father        Rheumatoid   Prostate cancer Father    Hyperlipidemia Brother    Other Maternal Grandmother        passed away after miscarriage   Leukemia Maternal Grandfather    Aneurysm Paternal Grandmother      Current Outpatient Medications:    Cholecalciferol (VITAMIN D-3) 125 MCG (5000 UT) TABS, Take 1 tablet by mouth daily., Disp: , Rfl:    clobetasol ointment (TEMOVATE) 0.05 %, Apply 1 topically 2 (two) times daily., Disp: 30 g, Rfl: 3   Coenzyme Q10 (COQ10 PO), Take 300 mg by mouth daily., Disp: , Rfl:    Cyanocobalamin (VITAMIN B-12 PO), Take 5,000 mcg by mouth daily., Disp: , Rfl:    estradiol (ESTRACE VAGINAL) 0.1 MG/GM vaginal cream, Place 1 Applicatorful vaginally 3 (three) times a week., Disp: 42.5 g, Rfl: 12   levothyroxine  (SYNTHROID) 100 MCG tablet, Take 1 tablet (100 mcg total) by mouth daily., Disp: 90 tablet, Rfl: 1   MAGNESIUM GLUCONATE PO, Take 300 mg by mouth daily., Disp: , Rfl:    metFORMIN (GLUCOPHAGE) 850 MG tablet, Take 1 tablet (850 mg total) by mouth 2 (two) times daily with a meal., Disp: 180 tablet, Rfl: 1   Naproxen Sodium (ALEVE PO), Take by mouth as needed., Disp: , Rfl:    sitaGLIPtin (JANUVIA) 100 MG tablet, Take 1 tablet (100 mg total) by mouth daily., Disp: 90 tablet, Rfl: 1   estradiol (ESTRACE) 1 MG tablet, Take 1 tablet (1 mg total) by mouth daily., Disp: 360 tablet, Rfl: 0   HYDROcodone-acetaminophen (NORCO) 10-325 MG tablet, Take 1 tablet by mouth every 6 (six) hours as needed for moderate/severe pain for 5 days (Patient not taking: Reported on 05/05/2022), Disp: 20 tablet, Rfl: 0   ondansetron (ZOFRAN) 4 MG tablet, Take 1-2 tablets (4-8 mg total) by mouth every 8 (eight) hours as needed for nausea. (Patient not taking: Reported on 05/05/2022), Disp: 10 tablet, Rfl: 0   progesterone (PROMETRIUM) 200 MG capsule, TAKE 1 CAPSULE BY MOUTH ONCE A DAY, Disp: 90 capsule, Rfl: 3  Review of Systems  Review of Systems  Constitutional: Negative for fever, chills, weight loss, malaise/fatigue and diaphoresis.  HENT: Negative for hearing loss, ear pain, nosebleeds, congestion, sore throat, neck pain, tinnitus and ear discharge.   Eyes: Negative for blurred vision, double vision, photophobia, pain, discharge and redness.  Respiratory: Negative for cough, hemoptysis, sputum production, shortness of breath, wheezing and stridor.   Cardiovascular: Negative for chest pain, palpitations, orthopnea, claudication, leg swelling and PND.  Gastrointestinal: negative for abdominal pain. Negative for heartburn, nausea, vomiting, diarrhea, constipation, blood in stool and melena.  Genitourinary: Negative for dysuria, urgency, frequency, hematuria and flank pain.  Musculoskeletal: Negative for myalgias, back pain,  joint pain and falls.  Skin: Negative for itching and rash.  Neurological: Negative for dizziness, tingling, tremors, sensory change, speech change, focal weakness, seizures, loss of consciousness, weakness and headaches.  Endo/Heme/Allergies: Negative for environmental allergies and polydipsia. Does not bruise/bleed easily.  Psychiatric/Behavioral: Negative for depression, suicidal ideas, hallucinations, memory loss and substance abuse. The patient is not nervous/anxious and does not have insomnia.        Objective:  Blood pressure 137/80, pulse 69, height '5\' 7"'$  (1.702 m), weight 178 lb (80.7 kg), last  menstrual period 11/07/2015.   Physical Exam  Vitals reviewed. Constitutional: She is oriented to person, place, and time. She appears well-developed and well-nourished.  HENT:  Head: Normocephalic and atraumatic.        Right Ear: External ear normal.  Left Ear: External ear normal.  Nose: Nose normal.  Mouth/Throat: Oropharynx is clear and moist.  Eyes: Conjunctivae and EOM are normal. Pupils are equal, round, and reactive to light. Right eye exhibits no discharge. Left eye exhibits no discharge. No scleral icterus.  Neck: Normal range of motion. Neck supple. No tracheal deviation present. No thyromegaly present.  Cardiovascular: Normal rate, regular rhythm, normal heart sounds and intact distal pulses.  Exam reveals no gallop and no friction rub.   No murmur heard. Respiratory: Effort normal and breath sounds normal. No respiratory distress. She has no wheezes. She has no rales. She exhibits no tenderness.  GI: Soft. Bowel sounds are normal. She exhibits no distension and no mass. There is no tenderness. There is no rebound and no guarding.  Genitourinary:  Breasts no masses skin changes or nipple changes bilaterally      Vulva is normal without lesions Vagina is pink moist without discharge Cervix normal in appearance and pap is done Uterus is normal size shape and contour Adnexa is  negative with normal sized ovaries  Rectal deferred because getting screening lately Musculoskeletal: Normal range of motion. She exhibits no edema and no tenderness.  Neurological: She is alert and oriented to person, place, and time. She has normal reflexes. She displays normal reflexes. No cranial nerve deficit. She exhibits normal muscle tone. Coordination normal.  Skin: Skin is warm and dry. No rash noted. No erythema. No pallor.  Psychiatric: She has a normal mood and affect. Her behavior is normal. Judgment and thought content normal.       Medications Ordered at today's visit: Meds ordered this encounter  Medications   estradiol (ESTRACE) 1 MG tablet    Sig: Take 1 tablet (1 mg total) by mouth daily.    Dispense:  360 tablet    Refill:  0   progesterone (PROMETRIUM) 200 MG capsule    Sig: TAKE 1 CAPSULE BY MOUTH ONCE A DAY    Dispense:  90 capsule    Refill:  3   estradiol (ESTRACE VAGINAL) 0.1 MG/GM vaginal cream    Sig: Place 1 Applicatorful vaginally 3 (three) times a week.    Dispense:  42.5 g    Refill:  12    Other orders placed at today's visit: No orders of the defined types were placed in this encounter.     Assessment:    Normal Gyn exam.   HRT going well Plan:    Hormone replacement therapy: hormone replacement therapy: see Rx above. Follow up in: 1 year.     Return in about 1 year (around 05/06/2023) for yearly.

## 2022-05-07 ENCOUNTER — Other Ambulatory Visit (HOSPITAL_COMMUNITY): Payer: Self-pay

## 2022-05-07 ENCOUNTER — Ambulatory Visit: Payer: 59 | Admitting: Family Medicine

## 2022-05-07 ENCOUNTER — Encounter: Payer: Self-pay | Admitting: Family Medicine

## 2022-05-07 ENCOUNTER — Other Ambulatory Visit: Payer: Self-pay

## 2022-05-07 VITALS — BP 130/74 | HR 71 | Temp 98.2°F | Ht 67.0 in | Wt 173.0 lb

## 2022-05-07 DIAGNOSIS — E1165 Type 2 diabetes mellitus with hyperglycemia: Secondary | ICD-10-CM

## 2022-05-07 DIAGNOSIS — Z13220 Encounter for screening for lipoid disorders: Secondary | ICD-10-CM

## 2022-05-07 DIAGNOSIS — E039 Hypothyroidism, unspecified: Secondary | ICD-10-CM | POA: Diagnosis not present

## 2022-05-07 LAB — LIPID PANEL
Cholesterol: 255 mg/dL — ABNORMAL HIGH (ref 0–200)
HDL: 108.6 mg/dL (ref >39.00)
LDL Cholesterol: 138 mg/dL — ABNORMAL HIGH (ref 0–99)
NonHDL: 146.22
Total CHOL/HDL Ratio: 2
Triglycerides: 39 mg/dL (ref 0.0–149.0)
VLDL: 7.8 mg/dL (ref 0.0–40.0)

## 2022-05-07 LAB — COMPREHENSIVE METABOLIC PANEL
ALT: 11 U/L (ref 0–35)
AST: 17 U/L (ref 0–37)
Albumin: 3.2 g/dL — ABNORMAL LOW (ref 3.5–5.2)
Alkaline Phosphatase: 54 U/L (ref 39–117)
BUN: 15 mg/dL (ref 6–23)
CO2: 28 mEq/L (ref 19–32)
Calcium: 10.2 mg/dL (ref 8.4–10.5)
Chloride: 100 mEq/L (ref 96–112)
Creatinine, Ser: 1.01 mg/dL (ref 0.40–1.20)
GFR: 60.46 mL/min (ref >60.00)
Glucose, Bld: 90 mg/dL (ref 70–99)
Potassium: 3.9 mEq/L (ref 3.5–5.1)
Sodium: 139 mEq/L (ref 135–145)
Total Bilirubin: 1.1 mg/dL (ref 0.2–1.2)
Total Protein: 7.8 g/dL (ref 6.0–8.3)

## 2022-05-07 LAB — TSH: TSH: 7.61 u[IU]/mL — ABNORMAL HIGH (ref 0.35–5.50)

## 2022-05-07 LAB — HEMOGLOBIN A1C: Hgb A1c MFr Bld: 7.2 % — ABNORMAL HIGH (ref 4.6–6.5)

## 2022-05-07 MED ORDER — LEVOTHYROXINE SODIUM 100 MCG PO TABS
100.0000 ug | ORAL_TABLET | Freq: Every day | ORAL | 1 refills | Status: DC
  Filled 2022-05-07: qty 90, 90d supply, fill #0
  Filled 2022-08-05: qty 90, 90d supply, fill #1

## 2022-05-07 MED ORDER — METFORMIN HCL 850 MG PO TABS
850.0000 mg | ORAL_TABLET | Freq: Two times a day (BID) | ORAL | 1 refills | Status: DC
  Filled 2022-05-07: qty 180, 90d supply, fill #0
  Filled 2022-07-29: qty 180, 90d supply, fill #1

## 2022-05-07 MED ORDER — SITAGLIPTIN PHOSPHATE 100 MG PO TABS
100.0000 mg | ORAL_TABLET | Freq: Every day | ORAL | 1 refills | Status: DC
  Filled 2022-05-07 – 2022-05-27 (×2): qty 90, 90d supply, fill #0
  Filled 2022-06-11: qty 30, 30d supply, fill #0
  Filled 2022-07-06: qty 30, 30d supply, fill #1
  Filled 2022-08-05: qty 30, 30d supply, fill #2

## 2022-05-07 NOTE — Progress Notes (Signed)
Subjective:  Patient ID: Jenna Carroll, female    DOB: 22-May-1960  Age: 61 y.o. MRN: [id number]  CC:  Chief Complaint  Patient presents with   Hypothyroidism   Diabetes    Pt states all is well   Depression    PHQ9 - 5     HPI Jenna Carroll presents for     Diabetes: Complicated by hyperglycemia.  Januvia started GYN, metformin had been increased and 50 mg twice daily previously.  Minimal diarrhea previously.  Was watching diet, tolerating meds overall last visit with blood sugars low 100s, some elevated postprandials.  A1c had improved at 7.1 from 8.0 prior.  No new side effects with meds.  Not on statin or ACE inhibitor at this time. Bunion surgery in October. Plans on RTTW in January - 20 hrs per week.  Less exercise with surgery and some dietary indiscretion with the holidays. Fasting: under 130 Postprandial: 180-250 (pizza).  Microalbumin: Normal ratio 05/29/2021 Optho, foot exam, pneumovax: Up-to-date Colon cancer screening due, was done in 2008, did have negative fecal occult blood testing in November 2022, June 2021. GI - Dr. Sydell Axon, having to reschedule study. Will be done in next few months.   Lab Results  Component Value Date   HGBA1C 7.1 (H) 02/05/2022   HGBA1C 8.0 (H) 10/31/2021   HGBA1C 8.1 (H) 07/24/2021   Lab Results  Component Value Date   MICROALBUR <0.7 05/29/2021   LDLCALC 132 (H) 05/29/2021   CREATININE 1.05 10/31/2021    Hypothyroidism: Lab Results  Component Value Date   TSH 2.62 02/05/2022  Taking medication daily.  Synthroid 100 mcg daily, dosage was increased earlier this year.  Stable TSH in September.  No new hot or cold intolerance. No new hair or skin changes, heart palpitations or new fatigue. No new weight changes.  Wt Readings from Last 3 Encounters:  05/07/22 173 lb (78.5 kg)  05/05/22 178 lb (80.7 kg)  02/05/22 170 lb (77.1 kg)      History Patient Active Problem List   Diagnosis Date Noted   Mixed hyperlipidemia  04/24/2021   Hormone replacement therapy 04/04/2021   Postmenopause 04/04/2021   Encounter for screening fecal occult blood testing 04/04/2021   Type 2 diabetes mellitus without complication, without long-term current use of insulin ([place]) 04/04/2021   Encounter for colorectal cancer screening 78/29/5621   Lichen sclerosus et atrophicus 10/25/2019   Screening for colorectal cancer 02/24/2018   Encounter for well woman exam with routine gynecological exam 02/24/2018   Encounter for gynecological examination with Papanicolaou smear of cervix 04/08/2016   IUD (intrauterine device) in place 04/08/2016   Diabetes mellitus type 2, diet-controlled ([place]) 09/29/2012   Microalbuminuria 09/29/2012   UTI (urinary tract infection) 09/29/2012   Skin moles 09/29/2012   Hypothyroidism 03/16/2012   Arthritis 03/16/2012   Vitamin D deficiency 03/16/2012   Past Medical History:  Diagnosis Date   Arthritis    DM (diabetes mellitus) ([place]) 02/17/2012   Hyperlipidemia, mild    Hypothyroidism    Thyroid disease    Past Surgical History:  Procedure Laterality Date   left ankle     TONSILLECTOMY AND ADENOIDECTOMY     WISDOM TOOTH EXTRACTION     No Known Allergies Prior to Admission medications   Medication Sig Start Date End Date Taking? Authorizing Provider  Cholecalciferol (VITAMIN D-3) 125 MCG (5000 UT) TABS Take 1 tablet by mouth daily.   Yes [provider]  clobetasol ointment (TEMOVATE) 0.05 % Apply  1 topically 2 (two) times daily. 04/04/21  Yes Derrek Monaco A, NP  Coenzyme Q10 (COQ10 PO) Take 300 mg by mouth daily.   Yes [provider]  Cyanocobalamin (VITAMIN B-12 PO) Take 5,000 mcg by mouth daily.   Yes [provider]  estradiol (ESTRACE VAGINAL) 0.1 MG/GM vaginal cream Place 1 Applicatorful vaginally 3 (three) times a week. 05/05/22  Yes Florian Buff, MD  estradiol (ESTRACE) 1 MG tablet Take 1 tablet (1 mg total) by mouth daily. 05/05/22 05/05/23 Yes Florian Buff, MD  levothyroxine (SYNTHROID) 100 MCG tablet Take 1 tablet (100 mcg total) by mouth daily. 02/05/22  Yes Wendie Agreste, MD  MAGNESIUM GLUCONATE PO Take 300 mg by mouth daily.   Yes [provider]  metFORMIN (GLUCOPHAGE) 850 MG tablet Take 1 tablet (850 mg total) by mouth 2 (two) times daily with a meal. 02/05/22  Yes Wendie Agreste, MD  Naproxen Sodium (ALEVE PO) Take by mouth as needed.   Yes [provider]  progesterone (PROMETRIUM) 200 MG capsule Take 1 capsule (200 mg total) by mouth daily. 05/05/22  Yes Florian Buff, MD  sitaGLIPtin (JANUVIA) 100 MG tablet Take 1 tablet (100 mg total) by mouth daily. 02/05/22  Yes Wendie Agreste, MD  HYDROcodone-acetaminophen [facility]) 10-325 MG tablet Take 1 tablet by mouth every 6 (six) hours as needed for moderate/severe pain for 5 days Patient not taking: Reported on 05/05/2022 03/11/22     ondansetron (ZOFRAN) 4 MG tablet Take 1-2 tablets (4-8 mg total) by mouth every 8 (eight) hours as needed for nausea. Patient not taking: Reported on 05/05/2022 03/12/22      Social History   Socioeconomic History   Marital status: Married    Spouse name: Not on file   Number of children: Not on file   Years of education: Not on file   Highest education level: Not on file  Occupational History   Not on file  Tobacco Use   Smoking status: Never   Smokeless tobacco: Never  Vaping Use   Vaping Use: Never used  Substance and Sexual Activity   Alcohol use: Yes    Comment: occ   Drug use: No   Sexual activity: Yes    Birth control/protection: Post-menopausal  Other Topics Concern   Not on file  Social History Narrative   Married 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   Social Determinants of [employer]: [place]  (05/05/2022)   Overall Financial Resource Strain (CARDIA)    Difficulty of Paying Living Expenses: Not hard at all  Food Insecurity: No Food Insecurity (05/05/2022)    Hunger Vital Sign    Worried About Running Out of Food in the Last Year: Never true    [place] in the Last Year: Never true  Transportation Needs: No Transportation Needs (05/05/2022)   PRAPARE - Hydrologist (Medical): No    Lack of Transportation (Non-Medical): No  Physical Activity: Sufficiently Active (05/05/2022)   Exercise Vital Sign    Days of Exercise per Week: 5 days    Minutes of Exercise per Session: 40 min  Stress: No Stress Concern Present (05/05/2022)   [place]    Feeling of Stress : Only a little  Social Connections: Socially Integrated (05/05/2022)   Social Connection and Isolation Panel [NHANES]    Frequency of Communication with Friends and  Family: Three times a week    Frequency of Social Gatherings with Friends and Family: Three times a week    Attends Religious Services: More than 4 times per year    Active Member of Clubs or Organizations: Yes    Attends Archivist Meetings: More than 4 times per year    Marital Status: Married  Human resources officer Violence: Not At Risk (05/05/2022)   Humiliation, Afraid, Rape, and Kick questionnaire    Fear of Current or Ex-Partner: No    Emotionally Abused: No    Physically Abused: No    Sexually Abused: No    Review of Systems  Constitutional:  Negative for fatigue and unexpected weight change.  Respiratory:  Negative for chest tightness and shortness of breath.   Cardiovascular:  Negative for chest pain, palpitations and leg swelling.  Gastrointestinal:  Negative for abdominal pain and blood in stool.  Neurological:  Negative for dizziness, syncope, light-headedness and headaches.     Objective:   Vitals:   05/07/22 1020  BP: 130/74  Pulse: 71  Temp: 98.2 F (36.8 C)  SpO2: 99%  Weight: 173 lb (78.5 kg)  Height: '5\' 7"'$  (1.702 m)     Physical Exam Vitals reviewed.  Constitutional:       Appearance: Normal appearance. She is well-developed.  HENT:     Head: Normocephalic and atraumatic.  Eyes:     Conjunctiva/sclera: Conjunctivae normal.     Pupils: Pupils are equal, round, and reactive to light.  Neck:     Vascular: No carotid bruit.  Cardiovascular:     Rate and Rhythm: Normal rate and regular rhythm.     Heart sounds: Normal heart sounds.  Pulmonary:     Effort: Pulmonary effort is normal.     Breath sounds: Normal breath sounds.  Abdominal:     Palpations: Abdomen is soft. There is no pulsatile mass.     Tenderness: There is no abdominal tenderness.  Musculoskeletal:     Right lower leg: No edema.     Left lower leg: No edema.  Skin:    General: Skin is warm and dry.  Neurological:     Mental Status: She is alert and oriented to person, place, and time.  Psychiatric:        Mood and Affect: Mood normal.        Behavior: Behavior normal.        Assessment & Plan:  Jenna Carroll is a 61 y.o. female . Type 2 diabetes mellitus with hyperglycemia, without long-term current use of insulin (HCC) - Plan: metFORMIN (GLUCOPHAGE) 850 MG tablet, sitaGLIPtin (JANUVIA) 100 MG tablet, Comprehensive metabolic panel, Hemoglobin A1c  -Prior hyperglycemia, improved control with last A1c, some decreased exercise, dietary changes as above with recent holidays and recent surgery.  May have slight increase in A1c and can adjust meds accordingly.  No changes for now, but will review labs to assess further.  Hypothyroidism, unspecified type - Plan: levothyroxine (SYNTHROID) 100 MCG tablet, TSH  -Tolerating current dose, check TSH, continue same.  Screening for hyperlipidemia - Plan: Comprehensive metabolic panel, Lipid panel  -Check lipids, discussed statin use with diabetes.  Will check labs initially then can discuss options including possible intermittent statin dosing.  Meds ordered this encounter  Medications   levothyroxine (SYNTHROID) 100 MCG tablet    Sig: Take 1  tablet (100 mcg total) by mouth daily.    Dispense:  90 tablet    Refill:  1  metFORMIN (GLUCOPHAGE) 850 MG tablet    Sig: Take 1 tablet (850 mg total) by mouth 2 (two) times daily with a meal.    Dispense:  180 tablet    Refill:  1   sitaGLIPtin (JANUVIA) 100 MG tablet    Sig: Take 1 tablet (100 mg total) by mouth daily.    Dispense:  90 tablet    Refill:  1   Patient Instructions  Depending on labs, may need to adjust meds, including possible statin. I will let you know.  Take care!    Signed,   Merri Ray, MD [place], [facility] 05/07/22 11:21 AM

## 2022-05-07 NOTE — Patient Instructions (Addendum)
Depending on labs, may need to adjust meds, including possible statin. I will let you know.  Take care!

## 2022-05-15 DIAGNOSIS — Z4889 Encounter for other specified surgical aftercare: Secondary | ICD-10-CM | POA: Diagnosis not present

## 2022-05-16 ENCOUNTER — Encounter: Payer: Self-pay | Admitting: Family Medicine

## 2022-05-27 ENCOUNTER — Other Ambulatory Visit (HOSPITAL_COMMUNITY): Payer: Self-pay

## 2022-05-27 ENCOUNTER — Encounter (HOSPITAL_COMMUNITY): Payer: Self-pay

## 2022-05-27 DIAGNOSIS — Z4889 Encounter for other specified surgical aftercare: Secondary | ICD-10-CM | POA: Diagnosis not present

## 2022-05-28 ENCOUNTER — Other Ambulatory Visit: Payer: Self-pay

## 2022-06-11 ENCOUNTER — Other Ambulatory Visit: Payer: Self-pay

## 2022-07-07 ENCOUNTER — Other Ambulatory Visit: Payer: Self-pay

## 2022-07-15 ENCOUNTER — Telehealth: Payer: Self-pay

## 2022-07-15 NOTE — Telephone Encounter (Signed)
Called and LM to find out where she had her eye exam done

## 2022-07-16 DIAGNOSIS — H5203 Hypermetropia, bilateral: Secondary | ICD-10-CM | POA: Diagnosis not present

## 2022-07-29 ENCOUNTER — Other Ambulatory Visit: Payer: Self-pay

## 2022-07-30 ENCOUNTER — Other Ambulatory Visit: Payer: Self-pay

## 2022-07-30 ENCOUNTER — Other Ambulatory Visit (HOSPITAL_COMMUNITY): Payer: Self-pay

## 2022-08-05 ENCOUNTER — Other Ambulatory Visit (HOSPITAL_COMMUNITY): Payer: Self-pay

## 2022-08-06 ENCOUNTER — Other Ambulatory Visit: Payer: Self-pay

## 2022-08-06 ENCOUNTER — Encounter: Payer: Self-pay | Admitting: *Deleted

## 2022-08-06 ENCOUNTER — Other Ambulatory Visit (HOSPITAL_COMMUNITY): Payer: Self-pay

## 2022-08-06 ENCOUNTER — Ambulatory Visit: Payer: 59 | Admitting: Family Medicine

## 2022-08-06 ENCOUNTER — Encounter: Payer: Self-pay | Admitting: Family Medicine

## 2022-08-06 VITALS — BP 128/74 | HR 84 | Temp 97.8°F | Ht 67.0 in | Wt 184.2 lb

## 2022-08-06 DIAGNOSIS — Z1211 Encounter for screening for malignant neoplasm of colon: Secondary | ICD-10-CM | POA: Diagnosis not present

## 2022-08-06 DIAGNOSIS — E039 Hypothyroidism, unspecified: Secondary | ICD-10-CM | POA: Diagnosis not present

## 2022-08-06 DIAGNOSIS — E1165 Type 2 diabetes mellitus with hyperglycemia: Secondary | ICD-10-CM

## 2022-08-06 DIAGNOSIS — E1169 Type 2 diabetes mellitus with other specified complication: Secondary | ICD-10-CM | POA: Diagnosis not present

## 2022-08-06 DIAGNOSIS — F4321 Adjustment disorder with depressed mood: Secondary | ICD-10-CM

## 2022-08-06 DIAGNOSIS — E785 Hyperlipidemia, unspecified: Secondary | ICD-10-CM | POA: Diagnosis not present

## 2022-08-06 LAB — TSH: TSH: 6.74 u[IU]/mL — ABNORMAL HIGH (ref 0.35–5.50)

## 2022-08-06 LAB — COMPREHENSIVE METABOLIC PANEL
ALT: 11 U/L (ref 0–35)
AST: 15 U/L (ref 0–37)
Albumin: 4.4 g/dL (ref 3.5–5.2)
Alkaline Phosphatase: 56 U/L (ref 39–117)
BUN: 18 mg/dL (ref 6–23)
CO2: 29 mEq/L (ref 19–32)
Calcium: 9.8 mg/dL (ref 8.4–10.5)
Chloride: 101 mEq/L (ref 96–112)
Creatinine, Ser: 0.94 mg/dL (ref 0.40–1.20)
GFR: 65.79 mL/min (ref >60.00)
Glucose, Bld: 144 mg/dL — ABNORMAL HIGH (ref 70–99)
Potassium: 4.6 mEq/L (ref 3.5–5.1)
Sodium: 137 mEq/L (ref 135–145)
Total Bilirubin: 0.7 mg/dL (ref 0.2–1.2)
Total Protein: 7.7 g/dL (ref 6.0–8.3)

## 2022-08-06 LAB — HEMOGLOBIN A1C: Hgb A1c MFr Bld: 7.4 % — ABNORMAL HIGH (ref 4.6–6.5)

## 2022-08-06 MED ORDER — SITAGLIPTIN PHOSPHATE 100 MG PO TABS
100.0000 mg | ORAL_TABLET | Freq: Every day | ORAL | 1 refills | Status: DC
  Filled 2022-08-06: qty 90, 90d supply, fill #0
  Filled 2022-09-04: qty 30, 30d supply, fill #0
  Filled 2022-09-30: qty 30, 30d supply, fill #1
  Filled 2022-10-27: qty 30, 30d supply, fill #2

## 2022-08-06 MED ORDER — ATORVASTATIN CALCIUM 10 MG PO TABS
10.0000 mg | ORAL_TABLET | Freq: Every day | ORAL | 1 refills | Status: DC
  Filled 2022-08-06: qty 90, 90d supply, fill #0

## 2022-08-06 MED ORDER — METFORMIN HCL 850 MG PO TABS
850.0000 mg | ORAL_TABLET | Freq: Two times a day (BID) | ORAL | 1 refills | Status: DC
  Filled 2022-08-06 – 2022-10-27 (×2): qty 180, 90d supply, fill #0

## 2022-08-06 MED ORDER — LEVOTHYROXINE SODIUM 100 MCG PO TABS
100.0000 ug | ORAL_TABLET | Freq: Every day | ORAL | 1 refills | Status: DC
  Filled 2022-08-06: qty 90, 90d supply, fill #0

## 2022-08-06 NOTE — Patient Instructions (Addendum)
Start lipitor initially few days per week at bedtime. If tolerated, can increase to daily if tolerated.  Exercise, time outside may be helpful. Staying active may help with mood as well as diabetes.  Follow up if any persistent depression symptoms or any new or worsening symptoms. Hang in there!   Managing Depression, Adult Depression is a mental health condition that affects your thoughts, feelings, and actions. Being diagnosed with depression can bring you relief if you did not know why you have felt or behaved a certain way. It could also leave you feeling overwhelmed. Finding ways to manage your symptoms can help you feel more positive about your future. How to manage lifestyle changes Being depressed is difficult. Depression can increase the level of everyday stress. Stress can make depression symptoms worse. You may believe your symptoms cannot be managed or will never improve. However, there are many things you can try to help manage your symptoms. There is hope. Managing stress  Stress is your body's reaction to life changes and events, both good and bad. Stress can add to your feelings of depression. Learning to manage your stress can help lessen your feelings of depression. Try some of the following approaches to reducing your stress (stress reduction techniques): Listen to music that you enjoy and that inspires you. Try using a meditation app or take a meditation class. Develop a practice that helps you connect with your spiritual self. Walk in nature, pray, or go to a place of worship. Practice deep breathing. To do this, inhale slowly through your nose. Pause at the top of your inhale for a few seconds and then exhale slowly, letting yourself relax. Repeat this three or four times. Practice yoga to help relax and work your muscles. Choose a stress reduction technique that works for you. These techniques take time and practice to develop. Set aside 5-15 minutes a day to do them.  Therapists can offer training in these techniques. Do these things to help manage stress: Keep a journal. Know your limits. Set healthy boundaries for yourself and others, such as saying "no" when you think something is too much. Pay attention to how you react to certain situations. You may not be able to control everything, but you can change your reaction. Add humor to your life by watching funny movies or shows. Make time for activities that you enjoy and that relax you. Spend less time using electronics, especially at night before bed. The light from screens can make your brain think it is time to get up rather than go to bed.  Medicines Medicines, such as antidepressants, are often a part of treatment for depression. Talk with your pharmacist or health care provider about all the medicines, supplements, and herbal products that you take, their possible side effects, and what medicines and other products are safe to take together. Make sure to report any side effects you may have to your health care provider. Relationships Your health care provider may suggest family therapy, couples therapy, or individual therapy as part of your treatment. How to recognize changes Everyone responds differently to treatment for depression. As you recover from depression, you may start to: Have more interest in doing activities. Feel more hopeful. Have more energy. Eat a more regular amount of food. Have better mental focus. It is important to recognize if your depression is not getting better or is getting worse. The symptoms you had in the beginning may return, such as: Feeling tired. Eating too much or too little.  Sleeping too much or too little. Feeling restless, agitated, or hopeless. Trouble focusing or making decisions. Having unexplained aches and pains. Feeling irritable, angry, or aggressive. If you or your family members notice these symptoms coming back, let your health care provider know  right away. Follow these instructions at home: Activity Try to get some form of exercise each day, such as walking. Try yoga, mindfulness, or other stress reduction techniques. Participate in group activities if you are able. Lifestyle Get enough sleep. Cut down on or stop using caffeine, tobacco, alcohol, and any other harmful substances. Eat a healthy diet that includes plenty of vegetables, fruits, whole grains, low-fat dairy products, and lean protein. Limit foods that are high in solid fats, added sugar, or salt (sodium). General instructions Take over-the-counter and prescription medicines only as told by your health care provider. Keep all follow-up visits. It is important for your health care provider to check on your mood, behavior, and medicines. Your health care provider may need to make changes to your treatment. Where to find support Talking to others  Friends and family members can be sources of support and guidance. Talk to trusted friends or family members about your condition. Explain your symptoms and let them know that you are working with a health care provider to treat your depression. Tell friends and family how they can help. Finances Find mental health providers that fit with your financial situation. Talk with your health care provider if you are worried about access to food, housing, or medicine. Call your insurance company to learn about your co-pays and prescription plan. Where to find more information You can find support in your area from: Anxiety and Depression Association of America (ADAA): adaa.org Mental Health America: mentalhealthamerica.net [employer] on Mental Illness: nami.org Contact a health care provider if: You stop taking your antidepressant medicines, and you have any of these symptoms: Nausea. Headache. Light-headedness. Chills and body aches. Not being able to sleep (insomnia). You or your friends and family think your depression  is getting worse. Get help right away if: You have thoughts of hurting yourself or others. Get help right away if you feel like you may hurt yourself or others, or have thoughts about taking your own life. Go to your nearest emergency room or: Call 911. Call the [place] at (787)415-7974 or 988. This is open 24 hours a day. Text the Crisis Text Line at 5616230828. This information is not intended to replace advice given to you by your health care provider. Make sure you discuss any questions you have with your health care provider. Document Revised: 09/10/2021 Document Reviewed: 09/10/2021 Elsevier Patient Education  [place].

## 2022-08-06 NOTE — Assessment & Plan Note (Signed)
Asymptomatic on current dose of medicine, check updated TSH as borderline control previously.  Medication adjustment accordingly.

## 2022-08-06 NOTE — Assessment & Plan Note (Signed)
Start Lipitor, initially 1 to 2 days/week, then increased up to daily as tolerated.  Recheck labs next visit.  RTC precautions.

## 2022-08-06 NOTE — Assessment & Plan Note (Signed)
Continue same dose meds, check labs with medication adjustment accordingly.

## 2022-08-06 NOTE — Progress Notes (Signed)
Subjective:  Patient ID: Jenna Carroll, female    DOB: [birth year]  Age: 62 y.o. MRN: [id number]  CC:  Chief Complaint  Patient presents with   Hypothyroidism   Diabetes   Referral    Pt requests referral to have colonoscopy by Dr Gala Romney     HPI Jenna Carroll presents for   Diabetes: With hyperglycemia.  Treated with metformin 850mg  BID.  Januvia 100mg  qd. Min nausea in am with all her meds - trying to space them out. No vomiting. No statin/ACE inhibitor.  Cholesterol elevated in December. Had A1c with labs at work - unknown results.  Home readings: Fasting:1405-150.  Postprandial: 110-180 No symptomatic lows.  Microalbumin: Due.  Normal in January 2023 Optho, foot exam, pneumovax:  Due for ophthalmology, due for foot exam - declined. Recent foot procedure.  Agrees to colonoscopy referral, Dr. Gala Romney.   Diabetic Foot Exam - Simple   No data filed      Lab Results  Component Value Date   HGBA1C 7.2 (H) 05/07/2022   HGBA1C 7.1 (H) 02/05/2022   HGBA1C 8.0 (H) 10/31/2021   Lab Results  Component Value Date   MICROALBUR <0.7 05/29/2021   [place] 138 (H) 05/07/2022   CREATININE 1.01 05/07/2022   Hypothyroidism: Lab Results  Component Value Date   TSH 7.61 (H) 05/07/2022  Borderline control on last labs.  Decided to remain on same dose of Synthroid, 100 mcg daily. Taking medication daily. No missed doses.  No new hot or cold intolerance. No new hair or skin changes, heart palpitations or new fatigue. No new weight changes.   Positive depression screening Multiple friends passed away recently and neighbor had 5 family members killed in car wreck.  Feels down with situations. A lot to process. Min anhedonia. No prior meds for depression. New shoes, plans for more exercise and being outside is helpful.     08/06/2022    8:44 AM 05/07/2022   10:18 AM 05/05/2022    8:47 AM 02/05/2022    9:10 AM 12/18/2021    9:26 AM  Depression screen PHQ 2/9  Decreased Interest 1 1  0 1 1  Down, Depressed, Hopeless 0 1 1 0 1  PHQ - 2 Score 1 2 1 1 2   Altered sleeping 2 1 1 1 1   Tired, decreased energy 3 1 1 1 1   Change in appetite 1 1 1  0 0  Feeling bad or failure about yourself  0 0 0 0 0  Trouble concentrating 2 0 0 0 1  Moving slowly or fidgety/restless 0 0 0 0 0  Suicidal thoughts 0 0 0 0 0  PHQ-9 Score 9 5 4 3 5        History Patient Active Problem List   Diagnosis Date Noted   Mixed hyperlipidemia 04/24/2021   Hormone replacement therapy 04/04/2021   Postmenopause 04/04/2021   Encounter for screening fecal occult blood testing 04/04/2021   Type 2 diabetes mellitus without complication, without long-term current use of insulin ([place]) 04/04/2021   Encounter for colorectal cancer screening [id number]   Lichen sclerosus et atrophicus 10/25/2019   Screening for colorectal cancer 02/24/2018   Encounter for well woman exam with routine gynecological exam 02/24/2018   Encounter for gynecological examination with Papanicolaou smear of cervix 04/08/2016   IUD (intrauterine device) in place 04/08/2016   Diabetes mellitus type 2, diet-controlled ([place]) 09/29/2012   Microalbuminuria 09/29/2012   UTI (urinary tract infection) 09/29/2012   Skin moles 09/29/2012  Hypothyroidism 03/16/2012   Arthritis 03/16/2012   Vitamin D deficiency 03/16/2012    Past Medical History:  Diagnosis Date   Arthritis    DM (diabetes mellitus) ([place]) 02/17/2012   Hyperlipidemia, mild    Hypothyroidism    Thyroid disease       Review of Systems  Constitutional:  Negative for fatigue and unexpected weight change.  Respiratory:  Negative for chest tightness and shortness of breath.   Cardiovascular:  Negative for chest pain, palpitations and leg swelling.  Gastrointestinal:  Negative for abdominal pain and blood in stool.  Neurological:  Negative for dizziness, syncope, light-headedness and headaches.     Objective:   Vitals:   08/06/22 0850  BP: 128/74  Pulse: 84   Temp: 97.8 F (36.6 C)  TempSrc: Temporal  SpO2: 98%  Weight: 184 lb 3.2 oz (83.6 kg)  Height: 5\' 7"  (1.702 m)     Physical Exam Vitals reviewed.  Constitutional:      Appearance: Normal appearance. She is well-developed.  HENT:     Head: Normocephalic and atraumatic.  Eyes:     Conjunctiva/sclera: Conjunctivae normal.     Pupils: Pupils are equal, round, and reactive to light.  Neck:     Vascular: No carotid bruit.  Cardiovascular:     Rate and Rhythm: Normal rate and regular rhythm.     Heart sounds: Normal heart sounds.  Pulmonary:     Effort: Pulmonary effort is normal.     Breath sounds: Normal breath sounds.  Abdominal:     Palpations: Abdomen is soft. There is no pulsatile mass.     Tenderness: There is no abdominal tenderness.  Musculoskeletal:     Right lower leg: No edema.     Left lower leg: No edema.  Skin:    General: Skin is warm and dry.  Neurological:     Mental Status: She is alert and oriented to person, place, and time.  Psychiatric:        Mood and Affect: Mood normal.        Behavior: Behavior normal.        Assessment & Plan:  Jenna Carroll is a 62 y.o. female . Hyperlipidemia associated with type 2 diabetes mellitus (HCC) -     Atorvastatin Calcium; Take 1 tablet (10 mg total) by mouth daily.  Dispense: 90 tablet; Refill: 1  Hypothyroidism, unspecified type -     Levothyroxine Sodium; Take 1 tablet (100 mcg total) by mouth daily.  Dispense: 90 tablet; Refill: 1 -     TSH  Type 2 diabetes mellitus with hyperglycemia, without long-term current use of insulin (HCC) -     metFORMIN HCl; Take 1 tablet (850 mg total) by mouth 2 (two) times daily with a meal.  Dispense: 180 tablet; Refill: 1 -     SITagliptin Phosphate; Take 1 tablet (100 mg total) by mouth daily.  Dispense: 90 tablet; Refill: 1 -     Hemoglobin A1c -     Comprehensive metabolic panel  Screen for colon cancer -     Ambulatory referral to Gastroenterology  Situational  depression  Refer to GI for colon cancer screening.  appears to be situational depression with losses as above.  Deferred meds or counseling at this time.  RTC precautions, but recommended increased activity/exercise, other coping techniques, managing depression handout given.   Patient Instructions  Start lipitor initially few days per week at bedtime. If tolerated, can increase to daily if tolerated.  Exercise,  time outside may be helpful. Staying active may help with mood as well as diabetes.  Follow up if any persistent depression symptoms or any new or worsening symptoms. Hang in there!   Managing Depression, Adult Depression is a mental health condition that affects your thoughts, feelings, and actions. Being diagnosed with depression can bring you relief if you did not know why you have felt or behaved a certain way. It could also leave you feeling overwhelmed. Finding ways to manage your symptoms can help you feel more positive about your future. How to manage lifestyle changes Being depressed is difficult. Depression can increase the level of everyday stress. Stress can make depression symptoms worse. You may believe your symptoms cannot be managed or will never improve. However, there are many things you can try to help manage your symptoms. There is hope. Managing stress  Stress is your body's reaction to life changes and events, both good and bad. Stress can add to your feelings of depression. Learning to manage your stress can help lessen your feelings of depression. Try some of the following approaches to reducing your stress (stress reduction techniques): Listen to music that you enjoy and that inspires you. Try using a meditation app or take a meditation class. Develop a practice that helps you connect with your spiritual self. Walk in nature, pray, or go to a place of worship. Practice deep breathing. To do this, inhale slowly through your nose. Pause at the top of your inhale  for a few seconds and then exhale slowly, letting yourself relax. Repeat this three or four times. Practice yoga to help relax and work your muscles. Choose a stress reduction technique that works for you. These techniques take time and practice to develop. Set aside 5-15 minutes a day to do them. Therapists can offer training in these techniques. Do these things to help manage stress: Keep a journal. Know your limits. Set healthy boundaries for yourself and others, such as saying "no" when you think something is too much. Pay attention to how you react to certain situations. You may not be able to control everything, but you can change your reaction. Add humor to your life by watching funny movies or shows. Make time for activities that you enjoy and that relax you. Spend less time using electronics, especially at night before bed. The light from screens can make your brain think it is time to get up rather than go to bed.  Medicines Medicines, such as antidepressants, are often a part of treatment for depression. Talk with your pharmacist or health care provider about all the medicines, supplements, and herbal products that you take, their possible side effects, and what medicines and other products are safe to take together. Make sure to report any side effects you may have to your health care provider. Relationships Your health care provider may suggest family therapy, couples therapy, or individual therapy as part of your treatment. How to recognize changes Everyone responds differently to treatment for depression. As you recover from depression, you may start to: Have more interest in doing activities. Feel more hopeful. Have more energy. Eat a more regular amount of food. Have better mental focus. It is important to recognize if your depression is not getting better or is getting worse. The symptoms you had in the beginning may return, such as: Feeling tired. Eating too much or too  little. Sleeping too much or too little. Feeling restless, agitated, or hopeless. Trouble focusing or making decisions. Having unexplained  aches and pains. Feeling irritable, angry, or aggressive. If you or your family members notice these symptoms coming back, let your health care provider know right away. Follow these instructions at home: Activity Try to get some form of exercise each day, such as walking. Try yoga, mindfulness, or other stress reduction techniques. Participate in group activities if you are able. Lifestyle Get enough sleep. Cut down on or stop using caffeine, tobacco, alcohol, and any other harmful substances. Eat a healthy diet that includes plenty of vegetables, fruits, whole grains, low-fat dairy products, and lean protein. Limit foods that are high in solid fats, added sugar, or salt (sodium). General instructions Take over-the-counter and prescription medicines only as told by your health care provider. Keep all follow-up visits. It is important for your health care provider to check on your mood, behavior, and medicines. Your health care provider may need to make changes to your treatment. Where to find support Talking to others  Friends and family members can be sources of support and guidance. Talk to trusted friends or family members about your condition. Explain your symptoms and let them know that you are working with a health care provider to treat your depression. Tell friends and family how they can help. Finances Find mental health providers that fit with your financial situation. Talk with your health care provider if you are worried about access to food, housing, or medicine. Call your insurance company to learn about your co-pays and prescription plan. Where to find more information You can find support in your area from: Anxiety and Depression Association of America (ADAA): adaa.org Mental Health America: mentalhealthamerica.net [employer]  on Mental Illness: nami.org Contact a health care provider if: You stop taking your antidepressant medicines, and you have any of these symptoms: Nausea. Headache. Light-headedness. Chills and body aches. Not being able to sleep (insomnia). You or your friends and family think your depression is getting worse. Get help right away if: You have thoughts of hurting yourself or others. Get help right away if you feel like you may hurt yourself or others, or have thoughts about taking your own life. Go to your nearest emergency room or: Call 911. Call the [facility] at (250)680-9210 or 988. This is open 24 hours a day. Text the Crisis Text Line at 973-734-3499. This information is not intended to replace advice given to you by your health care provider. Make sure you discuss any questions you have with your health care provider. Document Revised: 09/10/2021 Document Reviewed: 09/10/2021 Elsevier Patient Education  2023 [place],   Merri Ray, MD [place], [facility] 08/06/22 9:38 AM

## 2022-08-07 ENCOUNTER — Other Ambulatory Visit (HOSPITAL_COMMUNITY): Payer: Self-pay

## 2022-09-04 ENCOUNTER — Other Ambulatory Visit (HOSPITAL_COMMUNITY): Payer: Self-pay

## 2022-09-04 ENCOUNTER — Other Ambulatory Visit: Payer: Self-pay

## 2022-09-30 ENCOUNTER — Other Ambulatory Visit: Payer: Self-pay

## 2022-09-30 DIAGNOSIS — Z4889 Encounter for other specified surgical aftercare: Secondary | ICD-10-CM | POA: Diagnosis not present

## 2022-10-04 ENCOUNTER — Other Ambulatory Visit (HOSPITAL_COMMUNITY): Payer: Self-pay

## 2022-10-04 MED ORDER — SODIUM FLUORIDE 1.1 % DT GEL
DENTAL | 2 refills | Status: DC
  Filled 2022-10-04: qty 300, 90d supply, fill #0

## 2022-10-06 ENCOUNTER — Other Ambulatory Visit (HOSPITAL_COMMUNITY): Payer: Self-pay

## 2022-10-27 ENCOUNTER — Other Ambulatory Visit (HOSPITAL_COMMUNITY): Payer: Self-pay

## 2022-10-27 ENCOUNTER — Encounter (HOSPITAL_COMMUNITY): Payer: Self-pay

## 2022-10-27 ENCOUNTER — Other Ambulatory Visit: Payer: Self-pay

## 2022-11-05 DIAGNOSIS — D2271 Melanocytic nevi of right lower limb, including hip: Secondary | ICD-10-CM | POA: Diagnosis not present

## 2022-11-05 DIAGNOSIS — L814 Other melanin hyperpigmentation: Secondary | ICD-10-CM | POA: Diagnosis not present

## 2022-11-05 DIAGNOSIS — L573 Poikiloderma of Civatte: Secondary | ICD-10-CM | POA: Diagnosis not present

## 2022-11-05 DIAGNOSIS — D1801 Hemangioma of skin and subcutaneous tissue: Secondary | ICD-10-CM | POA: Diagnosis not present

## 2022-11-05 DIAGNOSIS — D2372 Other benign neoplasm of skin of left lower limb, including hip: Secondary | ICD-10-CM | POA: Diagnosis not present

## 2022-11-12 ENCOUNTER — Encounter: Payer: Self-pay | Admitting: Family Medicine

## 2022-11-12 ENCOUNTER — Ambulatory Visit: Payer: 59 | Admitting: Family Medicine

## 2022-11-12 ENCOUNTER — Other Ambulatory Visit (HOSPITAL_COMMUNITY): Payer: Self-pay

## 2022-11-12 ENCOUNTER — Other Ambulatory Visit: Payer: Self-pay

## 2022-11-12 DIAGNOSIS — E1169 Type 2 diabetes mellitus with other specified complication: Secondary | ICD-10-CM

## 2022-11-12 DIAGNOSIS — Z7984 Long term (current) use of oral hypoglycemic drugs: Secondary | ICD-10-CM | POA: Diagnosis not present

## 2022-11-12 DIAGNOSIS — E1165 Type 2 diabetes mellitus with hyperglycemia: Secondary | ICD-10-CM | POA: Diagnosis not present

## 2022-11-12 DIAGNOSIS — E785 Hyperlipidemia, unspecified: Secondary | ICD-10-CM | POA: Diagnosis not present

## 2022-11-12 DIAGNOSIS — E039 Hypothyroidism, unspecified: Secondary | ICD-10-CM

## 2022-11-12 LAB — HEMOGLOBIN A1C: Hgb A1c MFr Bld: 7.7 % — ABNORMAL HIGH (ref 4.6–6.5)

## 2022-11-12 LAB — MICROALBUMIN / CREATININE URINE RATIO
Creatinine,U: 89.8 mg/dL
Microalb Creat Ratio: 0.8 mg/g (ref 0.0–30.0)
Microalb, Ur: <0.7 mg/dL (ref 0.0–1.9)

## 2022-11-12 LAB — BASIC METABOLIC PANEL
BUN: 19 mg/dL (ref 6–23)
CO2: 28 mEq/L (ref 19–32)
Calcium: 9.5 mg/dL (ref 8.4–10.5)
Chloride: 101 mEq/L (ref 96–112)
Creatinine, Ser: 0.93 mg/dL (ref 0.40–1.20)
GFR: 66.52 mL/min (ref >60.00)
Glucose, Bld: 142 mg/dL — ABNORMAL HIGH (ref 70–99)
Potassium: 4.3 mEq/L (ref 3.5–5.1)
Sodium: 137 mEq/L (ref 135–145)

## 2022-11-12 LAB — TSH: TSH: 2.39 u[IU]/mL (ref 0.35–5.50)

## 2022-11-12 MED ORDER — SITAGLIPTIN PHOSPHATE 100 MG PO TABS
100.0000 mg | ORAL_TABLET | Freq: Every day | ORAL | 1 refills | Status: DC
  Filled 2022-11-12: qty 90, 90d supply, fill #0
  Filled 2022-11-28: qty 30, 30d supply, fill #0
  Filled 2022-12-24: qty 30, 30d supply, fill #1
  Filled 2023-01-22: qty 30, 30d supply, fill #2
  Filled 2023-02-24: qty 30, 30d supply, fill #3
  Filled 2023-03-30: qty 30, 30d supply, fill #4
  Filled 2023-04-25: qty 30, 30d supply, fill #5

## 2022-11-12 MED ORDER — LEVOTHYROXINE SODIUM 100 MCG PO TABS
100.0000 ug | ORAL_TABLET | Freq: Every day | ORAL | 1 refills | Status: DC
  Filled 2022-11-12: qty 90, 90d supply, fill #0
  Filled 2023-01-25: qty 90, 90d supply, fill #1

## 2022-11-12 MED ORDER — METFORMIN HCL 850 MG PO TABS
850.0000 mg | ORAL_TABLET | Freq: Two times a day (BID) | ORAL | 1 refills | Status: DC
  Filled 2022-11-12 – 2023-01-27 (×2): qty 180, 90d supply, fill #0

## 2022-11-12 MED ORDER — PRAVASTATIN SODIUM 20 MG PO TABS
20.0000 mg | ORAL_TABLET | Freq: Every day | ORAL | 1 refills | Status: DC
Start: 2022-11-12 — End: 2023-01-04
  Filled 2022-11-12: qty 30, 30d supply, fill #0
  Filled 2022-12-12: qty 30, 30d supply, fill #1

## 2022-11-12 NOTE — Progress Notes (Signed)
Subjective:  Patient ID: Jenna Carroll, female    DOB: July 27, 1960  Age: 62 y.o. MRN: [id number]  CC:  Chief Complaint  Patient presents with   Medical Management of Chronic Issues    HPI PERSEPHONIE HEGWOOD presents for  Diabetes: With hyperglycemia treated with metformin 850 mg twice daily, Januvia 100 mg daily.  Now on statin with hyperlipidemia.  Slight increase in A1c in March, diet changes recommended initially with option of additional medication. Home readings fasting: 130 yesterday Postprandial: 170-220. Some dietary indiscretion - room for improvement.  No symptomatic lows. No side effects with above meds.  Microalbumin: Due, normal January 2023. Optho, foot exam, pneumovax: Foot exam declined last visit.  Recent foot procedure at that time.podiatry has performed foot exam.  Ophthalmology: in February.  Diabetic health coach every other month.   Lab Results  Component Value Date   HGBA1C 7.4 (H) 08/06/2022   HGBA1C 7.2 (H) 05/07/2022   HGBA1C 7.1 (H) 02/05/2022   Lab Results  Component Value Date   MICROALBUR <0.7 05/29/2021   LDLCALC 138 (H) 05/07/2022   CREATININE 0.94 08/06/2022   Hyperlipidemia: Started on Lipitor 10 mg last visit with initial intermittent dosing. Tried taking every other day initially for 3 weeks. Felt joint pains, myalgias, mental fatigue. R arm felt sore and weak, but improving. Feels better off med - past 2 months.  Lab Results  Component Value Date   CHOL 255 (H) 05/07/2022   HDL 108.60 05/07/2022   LDLCALC 138 (H) 05/07/2022   TRIG 39.0 05/07/2022   CHOLHDL 2 05/07/2022   Lab Results  Component Value Date   ALT 11 08/06/2022   AST 15 08/06/2022   ALKPHOS 56 08/06/2022   BILITOT 0.7 08/06/2022    Positive depression screening, situational depression Discussed last visit in March, presence of past recently.  Left process at that time, minimal anhedonia.  Exercise to help manage.  Since last visit feels like getting better.   Myalgia/arthralgia limited exercise, started back walking - plans to continue to increase exercise.     11/12/2022    8:45 AM 08/06/2022    8:44 AM 05/07/2022   10:18 AM 05/05/2022    8:47 AM 02/05/2022    9:10 AM  Depression screen PHQ 2/9  Decreased Interest 0 1 1 0 1  Down, Depressed, Hopeless 1 0 1 1 0  PHQ - 2 Score 1 1 2 1 1   Altered sleeping 1 2 1 1 1   Tired, decreased energy 1 3 1 1 1   Change in appetite 1 1 1 1  0  Feeling bad or failure about yourself  0 0 0 0 0  Trouble concentrating 0 2 0 0 0  Moving slowly or fidgety/restless 0 0 0 0 0  Suicidal thoughts 0 0 0 0 0  PHQ-9 Score 4 9 5 4 3     Hypothyroidism: Lab Results  Component Value Date   TSH 6.74 (H) 08/06/2022  Borderline TSH prior, 100 mcg daily as of her March visit.  TSH still borderline but slightly improved from 3 months prior.  Synthroid daily - compliant.        History Patient Active Problem List   Diagnosis Date Noted   Hyperlipidemia associated with type 2 diabetes mellitus (HCC) 04/24/2021   Hormone replacement therapy 04/04/2021   Postmenopause 04/04/2021   Encounter for screening fecal occult blood testing 04/04/2021   Type 2 diabetes mellitus with hyperglycemia, without long-term current use of insulin (HCC) 04/04/2021  Encounter for colorectal cancer screening 10/25/2019   Lichen sclerosus et atrophicus 10/25/2019   Screening for colorectal cancer 02/24/2018   Encounter for well woman exam with routine gynecological exam 02/24/2018   Encounter for gynecological examination with Papanicolaou smear of cervix 04/08/2016   IUD (intrauterine device) in place 04/08/2016   Diabetes mellitus type 2, diet-controlled (HCC) 09/29/2012   Microalbuminuria 09/29/2012   UTI (urinary tract infection) 09/29/2012   Skin moles 09/29/2012   Hypothyroidism 03/16/2012   Arthritis 03/16/2012   Vitamin D deficiency 03/16/2012   Past Medical History:  Diagnosis Date   Arthritis    DM (diabetes  mellitus) (HCC) 02/17/2012   Hyperlipidemia, mild    Hypothyroidism    Thyroid disease    Past Surgical History:  Procedure Laterality Date   left ankle     TONSILLECTOMY AND ADENOIDECTOMY     WISDOM TOOTH EXTRACTION     Allergies  Allergen Reactions   Lipitor [Atorvastatin] Other (See Comments)    Arthralgia/myalgia.    Prior to Admission medications   Medication Sig Start Date End Date Taking? Authorizing Provider  Cholecalciferol (VITAMIN D-3) 125 MCG (5000 UT) TABS Take 1 tablet by mouth daily.   Yes [provider]  clobetasol ointment (TEMOVATE) 0.05 % Apply 1 topically 2 (two) times daily. 04/04/21  Yes [name], NP  Coenzyme Q10 (COQ10 PO) Take 300 mg by mouth daily.   Yes [provider]  Cyanocobalamin (VITAMIN B-12 PO) Take 5,000 mcg by mouth daily.   Yes [provider]  estradiol (ESTRACE) 1 MG tablet Take 1 tablet (1 mg total) by mouth daily. 05/05/22 05/05/23 Yes [name], MD  levothyroxine (SYNTHROID) 100 MCG tablet Take 1 tablet (100 mcg total) by mouth daily. 08/06/22  Yes Shade Flood, MD  MAGNESIUM GLUCONATE PO Take 300 mg by mouth daily.   Yes [provider]  metFORMIN (GLUCOPHAGE) 850 MG tablet Take 1 tablet (850 mg total) by mouth 2 (two) times daily with a meal. 08/06/22  Yes Shade Flood, MD  Naproxen Sodium (ALEVE PO) Take by mouth as needed.   Yes [provider]  progesterone (PROMETRIUM) 200 MG capsule Take 1 capsule (200 mg total) by mouth daily. 05/05/22  Yes [name], MD  sitaGLIPtin (JANUVIA) 100 MG tablet Take 1 tablet (100 mg total) by mouth daily. 08/06/22  Yes Shade Flood, MD  sodium fluoride (SODIUM FLUORIDE 5000 PPM) 1.1 % GEL dental gel Apply 1 time per day at night; Apply pea-sized amount to toothbrush. Brush a full three mintues. Spit out excess. Do not rinse following use 07/16/22  Yes Sharman Cheek, DMD  atorvastatin (LIPITOR) 10 MG tablet Take 1 tablet (10 mg  total) by mouth daily. Patient not taking: Reported on 11/12/2022 08/06/22   Shade Flood, MD   Social History   Socioeconomic History   Marital status: Married    Spouse name: Not on file   Number of children: Not on file   Years of education: Not on file   Highest education level: Not on file  Occupational History   Not on file  Tobacco Use   Smoking status: Never   Smokeless tobacco: Never  Vaping Use   Vaping Use: Never used  Substance and Sexual Activity   Alcohol use: Yes    Comment: occ   Drug use: No   Sexual activity: Yes    Birth control/protection: Post-menopausal  Other Topics Concern   Not on file  Social History Narrative   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   Social Determinants of [employer]: Low Risk  (11/11/2022)   Overall Financial Resource Strain (CARDIA)    Difficulty of Paying Living Expenses: Not hard at all  Food Insecurity: No Food Insecurity (11/11/2022)   Hunger Vital Sign    Worried About Running Out of Food in the Last Year: Never true    Ran Out of Food in the Last Year: Never true  Transportation Needs: No Transportation Needs (11/11/2022)   PRAPARE - Administrator, Civil Service (Medical): No    Lack of Transportation (Non-Medical): No  Physical Activity: Insufficiently Active (11/11/2022)   Exercise Vital Sign    Days of Exercise per Week: 1 day    Minutes of Exercise per Session: 10 min  Stress: No Stress Concern Present (11/11/2022)   [employer] of Occupational Health - Occupational Stress Questionnaire    Feeling of Stress : Only a little  Social Connections: Unknown (11/11/2022)   Social Connection and Isolation Panel [NHANES]    Frequency of Communication with Friends and Family: Twice a week    Frequency of Social Gatherings with Friends and Family: Patient declined    Attends Religious Services: More than 4 times per year    Active Member of [employer] or  Organizations: Yes    Attends Banker Meetings: 1 to 4 times per year    Marital Status: Married  Catering manager Violence: Not At Risk (05/05/2022)   Humiliation, Afraid, Rape, and Kick questionnaire    Fear of Current or Ex-Partner: No    Emotionally Abused: No    Physically Abused: No    Sexually Abused: No    Review of Systems  Constitutional:  Negative for fatigue and unexpected weight change.  Respiratory:  Negative for chest tightness and shortness of breath.   Cardiovascular:  Negative for chest pain, palpitations and leg swelling.  Gastrointestinal:  Negative for abdominal pain and blood in stool.  Neurological:  Negative for dizziness, syncope, light-headedness and headaches.     Objective:   Vitals:   11/12/22 0835  BP: 126/70  Pulse: 71  Temp: 97.8 F (36.6 C)  TempSrc: Temporal  SpO2: 99%  Weight: 187 lb (84.8 kg)  Height: 5\' 7"  (1.702 m)     Physical Exam Vitals reviewed.  Constitutional:      Appearance: Normal appearance. She is well-developed.  HENT:     Head: Normocephalic and atraumatic.  Eyes:     Conjunctiva/sclera: Conjunctivae normal.     Pupils: Pupils are equal, round, and reactive to light.  Neck:     Vascular: No carotid bruit.  Cardiovascular:     Rate and Rhythm: Normal rate and regular rhythm.     Heart sounds: Normal heart sounds.  Pulmonary:     Effort: Pulmonary effort is normal.     Breath sounds: Normal breath sounds.  Abdominal:     Palpations: Abdomen is soft. There is no pulsatile mass.     Tenderness: There is no abdominal tenderness.  Musculoskeletal:     Right lower leg: No edema.     Left lower leg: No edema.  Skin:    General: Skin is warm and dry.  Neurological:     General: No focal deficit present.     Mental Status: She is alert and oriented to person, place, and time.     Comments: Equal upper extremity strength,  no focal weakness appreciated.  Psychiatric:        Mood and Affect: Mood  normal.        Behavior: Behavior normal.      Assessment & Plan:  STAYSHA TRUBY is a 62 y.o. female . Hyperlipidemia associated with type 2 diabetes mellitus (HCC)  -Tolerating Lipitor with myalgias, arthralgias, no objective weakness noted on exam.  Symptoms have improved off statin.  Will try low-dose pravastatin as alternative but potential side effects discussed and if arthralgias/myalgias recur would hold on further statins, consider Zetia or PCSK9 inhibitor at that point.  RTC precautions.  Hypothyroidism, unspecified type - Plan: levothyroxine (SYNTHROID) 100 MCG tablet  -Borderline TSH, repeat labs, Synthroid refilled same dose for now depending on lab results.  Type 2 diabetes mellitus with hyperglycemia, without long-term current use of insulin (HCC) - Plan: metFORMIN (GLUCOPHAGE) 850 MG tablet, sitaGLIPtin (JANUVIA) 100 MG tablet, Basic metabolic panel, Hemoglobin A1c, Microalbumin / creatinine urine ratio  -Check labs, adjust plan accordingly.  Continue metformin, Januvia same doses for now.  Does have some room for improvement with diet and plans on increased exercise, that may affect med changes if still borderline.  20-month follow-up.  Meds ordered this encounter  Medications   levothyroxine (SYNTHROID) 100 MCG tablet    Sig: Take 1 tablet (100 mcg total) by mouth daily.    Dispense:  90 tablet    Refill:  1   metFORMIN (GLUCOPHAGE) 850 MG tablet    Sig: Take 1 tablet (850 mg total) by mouth 2 (two) times daily with a meal.    Dispense:  180 tablet    Refill:  1   sitaGLIPtin (JANUVIA) 100 MG tablet    Sig: Take 1 tablet (100 mg total) by mouth daily.    Dispense:  90 tablet    Refill:  1   pravastatin (PRAVACHOL) 20 MG tablet    Sig: Take 1 tablet (20 mg total) by mouth daily.    Dispense:  30 tablet    Refill:  1   Patient Instructions  Try pravastatin in place of lipitor. Once per week. Then increase by one day every week if tolerated. If issues with this med  as well let me know so we can look at other options.  Keep working on exercise and diet. Depending on A1c, may need to adjust meds - I will let you know.   Take care.     Signed,   Meredith Staggers, MD [facility], [facility] 11/12/22 9:19 AM

## 2022-11-12 NOTE — Patient Instructions (Addendum)
Try pravastatin in place of lipitor. Once per week. Then increase by one day every week if tolerated. If issues with this med as well let me know so we can look at other options.  Keep working on exercise and diet. Depending on A1c, may need to adjust meds - I will let you know.   Take care.

## 2022-11-14 ENCOUNTER — Telehealth: Payer: Self-pay

## 2022-11-14 NOTE — Telephone Encounter (Signed)
Encounter opened in error

## 2022-11-15 ENCOUNTER — Encounter: Payer: Self-pay | Admitting: Family Medicine

## 2022-11-26 ENCOUNTER — Encounter: Payer: Self-pay | Admitting: Family Medicine

## 2022-11-26 LAB — HM DIABETES EYE EXAM

## 2022-11-28 ENCOUNTER — Other Ambulatory Visit: Payer: Self-pay

## 2022-12-12 ENCOUNTER — Other Ambulatory Visit (HOSPITAL_COMMUNITY): Payer: Self-pay

## 2022-12-24 ENCOUNTER — Other Ambulatory Visit (HOSPITAL_COMMUNITY): Payer: Self-pay

## 2023-01-04 ENCOUNTER — Other Ambulatory Visit: Payer: Self-pay | Admitting: Family Medicine

## 2023-01-04 DIAGNOSIS — E1169 Type 2 diabetes mellitus with other specified complication: Secondary | ICD-10-CM

## 2023-01-05 ENCOUNTER — Other Ambulatory Visit (HOSPITAL_COMMUNITY): Payer: Self-pay

## 2023-01-05 MED ORDER — PRAVASTATIN SODIUM 20 MG PO TABS
20.0000 mg | ORAL_TABLET | Freq: Every day | ORAL | 1 refills | Status: AC
Start: 2023-01-05 — End: ?
  Filled 2023-01-05: qty 30, 30d supply, fill #0
  Filled 2023-01-25 – 2023-01-29 (×2): qty 30, 30d supply, fill #1

## 2023-01-22 ENCOUNTER — Other Ambulatory Visit (HOSPITAL_COMMUNITY): Payer: Self-pay

## 2023-01-25 ENCOUNTER — Other Ambulatory Visit (HOSPITAL_COMMUNITY): Payer: Self-pay

## 2023-01-27 ENCOUNTER — Other Ambulatory Visit: Payer: Self-pay

## 2023-01-27 ENCOUNTER — Other Ambulatory Visit (HOSPITAL_COMMUNITY): Payer: Self-pay

## 2023-02-06 ENCOUNTER — Other Ambulatory Visit (HOSPITAL_COMMUNITY): Payer: Self-pay | Admitting: Family Medicine

## 2023-02-06 DIAGNOSIS — Z1231 Encounter for screening mammogram for malignant neoplasm of breast: Secondary | ICD-10-CM

## 2023-02-09 ENCOUNTER — Encounter: Payer: Self-pay | Admitting: *Deleted

## 2023-02-11 ENCOUNTER — Ambulatory Visit: Payer: 59 | Admitting: Family Medicine

## 2023-02-17 DIAGNOSIS — E119 Type 2 diabetes mellitus without complications: Secondary | ICD-10-CM | POA: Diagnosis not present

## 2023-02-17 DIAGNOSIS — M79672 Pain in left foot: Secondary | ICD-10-CM | POA: Diagnosis not present

## 2023-02-18 ENCOUNTER — Other Ambulatory Visit (HOSPITAL_COMMUNITY): Payer: Self-pay

## 2023-02-18 ENCOUNTER — Encounter: Payer: Self-pay | Admitting: Family Medicine

## 2023-02-18 ENCOUNTER — Other Ambulatory Visit: Payer: Self-pay

## 2023-02-18 ENCOUNTER — Ambulatory Visit: Payer: 59 | Admitting: Family Medicine

## 2023-02-18 VITALS — BP 124/68 | HR 68 | Temp 98.5°F | Wt 184.8 lb

## 2023-02-18 DIAGNOSIS — Z7984 Long term (current) use of oral hypoglycemic drugs: Secondary | ICD-10-CM

## 2023-02-18 DIAGNOSIS — Z23 Encounter for immunization: Secondary | ICD-10-CM | POA: Diagnosis not present

## 2023-02-18 DIAGNOSIS — E1169 Type 2 diabetes mellitus with other specified complication: Secondary | ICD-10-CM

## 2023-02-18 DIAGNOSIS — M791 Myalgia, unspecified site: Secondary | ICD-10-CM | POA: Diagnosis not present

## 2023-02-18 DIAGNOSIS — E1165 Type 2 diabetes mellitus with hyperglycemia: Secondary | ICD-10-CM | POA: Diagnosis not present

## 2023-02-18 DIAGNOSIS — E785 Hyperlipidemia, unspecified: Secondary | ICD-10-CM

## 2023-02-18 LAB — COMPREHENSIVE METABOLIC PANEL
ALT: 9 U/L (ref 0–35)
AST: 14 U/L (ref 0–37)
Albumin: 4.4 g/dL (ref 3.5–5.2)
Alkaline Phosphatase: 66 U/L (ref 39–117)
BUN: 18 mg/dL (ref 6–23)
CO2: 27 meq/L (ref 19–32)
Calcium: 9.6 mg/dL (ref 8.4–10.5)
Chloride: 104 meq/L (ref 96–112)
Creatinine, Ser: 0.87 mg/dL (ref 0.40–1.20)
GFR: 71.92 mL/min (ref >60.00)
Glucose, Bld: 125 mg/dL — ABNORMAL HIGH (ref 70–99)
Potassium: 4.1 meq/L (ref 3.5–5.1)
Sodium: 138 meq/L (ref 135–145)
Total Bilirubin: 1 mg/dL (ref 0.2–1.2)
Total Protein: 7.2 g/dL (ref 6.0–8.3)

## 2023-02-18 LAB — LIPID PANEL
Cholesterol: 239 mg/dL — ABNORMAL HIGH (ref 0–200)
HDL: 99.4 mg/dL (ref >39.00)
LDL Cholesterol: 130 mg/dL — ABNORMAL HIGH (ref 0–99)
NonHDL: 139.63
Total CHOL/HDL Ratio: 2
Triglycerides: 50 mg/dL (ref 0.0–149.0)
VLDL: 10 mg/dL (ref 0.0–40.0)

## 2023-02-18 LAB — CK: Total CK: 95 U/L (ref 7–177)

## 2023-02-18 LAB — HEMOGLOBIN A1C: Hgb A1c MFr Bld: 8 % — ABNORMAL HIGH (ref 4.6–6.5)

## 2023-02-18 MED ORDER — PRAVASTATIN SODIUM 20 MG PO TABS
20.0000 mg | ORAL_TABLET | Freq: Every day | ORAL | 1 refills | Status: DC
Start: 2023-02-18 — End: 2023-05-25
  Filled 2023-02-18 – 2023-03-30 (×2): qty 30, 30d supply, fill #0
  Filled 2023-04-25: qty 30, 30d supply, fill #1

## 2023-02-18 NOTE — Patient Instructions (Addendum)
Depending on A1c, can consider GLP1 injection or SGLT2 pill. Let me know if you have a preference, but we can discuss further once we see your A1c.   Glad to hear the pravastatin has been better tolerated.  I will check your lipids today and if those are still elevated can increase to a few more days per week if still tolerated.  I am also checking a muscle enzyme test.  Recheck in 3 months, but let me know if there are questions in the meantime.  Thanks for coming in today.

## 2023-02-18 NOTE — Progress Notes (Signed)
Subjective:  Patient ID: Jenna Carroll, female    DOB: [birth year]  Age: 62 y.o. MRN: [id number]  CC:  Chief Complaint  Patient presents with   Medical Management of Chronic Issues    Med and labs Had foot exam 10/1. Needs new referral for colonoscopy.    HPI KATELEN LUEPKE presents for   Diabetes: With hyperglycemia, slight increase in readings since December 2023, last A1c 7.7 in June, elected to try diet/exercise changes and continued on metformin 850 mg twice daily, Januvia 100 mg daily.  Previously prescribed statin, but had joint pains, myalgias, mental fatigue with every other day dosing of Lipitor 10 mg.  Improved symptoms 2 months prior to her June visit.  Statin with history of hyperlipidemia.  Decided to try pravastatin in place of Lipitor, then option of Zetia or PCSK9 inhibitor if intolerant. Taking pravastatin 2 days per week - minimal muscle aches, not like Lipitor. Taking CoQ10.  No FH of MEN syndrome, thyroid CA or personal hx of pancreatitis.  Home readings fasting:100-140 Postprandial: none recent.  Symptomatic lows: none.  Microalbumin: normal 11/12/22 Optho, foot exam, pneumovax:  Foot exam normal at podiatry yesterday with sensation testing. Record is being sent.  Weight has improved 3 #. Increased walking, cutting back on portions of food. More vegetables. Decreasing bread, avoiding desserts.   Wt Readings from Last 3 Encounters:  02/18/23 184 lb 12.8 oz (83.8 kg)  11/12/22 187 lb (84.8 kg)  08/06/22 184 lb 3.2 oz (83.6 kg)    Lab Results  Component Value Date   HGBA1C 7.7 (H) 11/12/2022   HGBA1C 7.4 (H) 08/06/2022   HGBA1C 7.2 (H) 05/07/2022   Lab Results  Component Value Date   MICROALBUR <0.7 11/12/2022   LDLCALC 138 (H) 05/07/2022   CREATININE 0.93 11/12/2022   Lab Results  Component Value Date   CHOL 255 (H) 05/07/2022   HDL 108.60 05/07/2022   LDLCALC 138 (H) 05/07/2022   TRIG 39.0 05/07/2022   CHOLHDL 2 05/07/2022     Hypothyroidism: Lab Results  Component Value Date   TSH 2.39 11/12/2022  Stable on June labs, continue same dose Synthroid.  Health maintenance She is coordinating colonoscopy with gastroenterology and will let me know if new referral is needed.  Has scheduled Pap, Tdap today.  Received flu vaccine previously -records requested. History Patient Active Problem List   Diagnosis Date Noted   Hyperlipidemia associated with type 2 diabetes mellitus (HCC) 04/24/2021   Hormone replacement therapy 04/04/2021   Postmenopause 04/04/2021   Encounter for screening fecal occult blood testing 04/04/2021   Type 2 diabetes mellitus with hyperglycemia, without long-term current use of insulin (HCC) 04/04/2021   Encounter for colorectal cancer screening 10/25/2019   Lichen sclerosus et atrophicus 10/25/2019   Screening for colorectal cancer 02/24/2018   Encounter for well woman exam with routine gynecological exam 02/24/2018   Encounter for gynecological examination with Papanicolaou smear of cervix 04/08/2016   IUD (intrauterine device) in place 04/08/2016   Diabetes mellitus type 2, diet-controlled (HCC) 09/29/2012   Microalbuminuria 09/29/2012   UTI (urinary tract infection) 09/29/2012   Skin mole 09/29/2012   Hypothyroidism 03/16/2012   Arthritis 03/16/2012   Vitamin D deficiency 03/16/2012   Past Medical History:  Diagnosis Date   Arthritis    DM (diabetes mellitus) (HCC) 02/17/2012   Hyperlipidemia, mild    Hypothyroidism    Thyroid disease    Past Surgical History:  Procedure Laterality Date   left ankle  TONSILLECTOMY AND ADENOIDECTOMY     WISDOM TOOTH EXTRACTION     Allergies  Allergen Reactions   Lipitor [Atorvastatin] Other (See Comments)    Arthralgia/myalgia.    Prior to Admission medications   Medication Sig Start Date End Date Taking? Authorizing Provider  Cholecalciferol (VITAMIN D-3) 125 MCG (5000 UT) TABS Take 1 tablet by mouth daily.   Yes [provider]  clobetasol ointment (TEMOVATE) 0.05 % Apply 1 topically 2 (two) times daily. 04/04/21  Yes [name], NP  Coenzyme Q10 (COQ10 PO) Take 300 mg by mouth daily.   Yes [provider]  Cyanocobalamin (VITAMIN B-12 PO) Take 5,000 mcg by mouth daily.   Yes [provider]  estradiol (ESTRACE) 1 MG tablet Take 1 tablet (1 mg total) by mouth daily. 05/05/22 05/05/23 Yes [name], MD  levothyroxine (SYNTHROID) 100 MCG tablet Take 1 tablet (100 mcg total) by mouth daily. 11/12/22  Yes Shade Flood, MD  MAGNESIUM GLUCONATE PO Take 300 mg by mouth daily.   Yes [provider]  metFORMIN (GLUCOPHAGE) 850 MG tablet Take 1 tablet (850 mg total) by mouth 2 (two) times daily with a meal. 11/12/22  Yes Shade Flood, MD  Naproxen Sodium (ALEVE PO) Take by mouth as needed.   Yes [provider]  pravastatin (PRAVACHOL) 20 MG tablet Take 1 tablet (20 mg total) by mouth daily. 01/05/23  Yes Shade Flood, MD  progesterone (PROMETRIUM) 200 MG capsule Take 1 capsule (200 mg total) by mouth daily. 05/05/22  Yes [name], MD  sitaGLIPtin (JANUVIA) 100 MG tablet Take 1 tablet (100 mg total) by mouth daily. 11/12/22  Yes Shade Flood, MD  sodium fluoride (SODIUM FLUORIDE 5000 PPM) 1.1 % GEL dental gel Apply 1 time per day at night; Apply pea-sized amount to toothbrush. Brush a full three mintues. Spit out excess. Do not rinse following use 07/16/22  Yes Sharman Cheek, DMD   Social History   Socioeconomic History   Marital status: Married    Spouse name: Not on file   Number of children: Not on file   Years of education: Not on file   Highest education level: Not on file  Occupational History   Not on file  Tobacco Use   Smoking status: Never   Smokeless tobacco: Never  Vaping Use   Vaping status: Never Used  Substance and Sexual Activity   Alcohol use: Yes    Comment: occ   Drug use: No   Sexual activity: Yes    Birth  control/protection: Post-menopausal  Other Topics Concern   Not on file  Social History Narrative   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   Social Determinants of [employer]: Low Risk  (11/11/2022)   Overall Financial Resource Strain (CARDIA)    Difficulty of Paying Living Expenses: Not hard at all  Food Insecurity: No Food Insecurity (11/11/2022)   Hunger Vital Sign    Worried About Running Out of Food in the Last Year: Never true    Ran Out of Food in the Last Year: Never true  Transportation Needs: No Transportation Needs (11/11/2022)   PRAPARE - Administrator, Civil Service (Medical): No    Lack of Transportation (Non-Medical): No  Physical Activity: Insufficiently Active (11/11/2022)   Exercise Vital Sign    Days of Exercise per Week: 1 day    Minutes of Exercise per Session: 10 min  Stress: No Stress Concern Present (11/11/2022)   [employer] of Occupational Health - Occupational Stress Questionnaire    Feeling of Stress : Only a little  Social Connections: Unknown (11/11/2022)   Social Connection and Isolation Panel [NHANES]    Frequency of Communication with Friends and Family: Twice a week    Frequency of Social Gatherings with Friends and Family: Patient declined    Attends Religious Services: More than 4 times per year    Active Member of [employer] or Organizations: Yes    Attends Banker Meetings: 1 to 4 times per year    Marital Status: Married  Catering manager Violence: Not At Risk (05/05/2022)   Humiliation, Afraid, Rape, and Kick questionnaire    Fear of Current or Ex-Partner: No    Emotionally Abused: No    Physically Abused: No    Sexually Abused: No    Review of Systems  Constitutional:  Negative for fatigue and unexpected weight change.  Respiratory:  Negative for chest tightness and shortness of breath.   Cardiovascular:  Negative for chest pain, palpitations and leg swelling.   Gastrointestinal:  Negative for abdominal pain and blood in stool.  Neurological:  Negative for dizziness, syncope, light-headedness and headaches.     Objective:   Vitals:   02/18/23 0845  BP: 124/68  Pulse: 68  Temp: 98.5 F (36.9 C)  TempSrc: Temporal  SpO2: 97%  Weight: 184 lb 12.8 oz (83.8 kg)   Physical Exam Vitals reviewed.  Constitutional:      Appearance: Normal appearance. She is well-developed.  HENT:     Head: Normocephalic and atraumatic.  Eyes:     Conjunctiva/sclera: Conjunctivae normal.     Pupils: Pupils are equal, round, and reactive to light.  Neck:     Vascular: No carotid bruit.  Cardiovascular:     Rate and Rhythm: Normal rate and regular rhythm.     Heart sounds: Normal heart sounds.  Pulmonary:     Effort: Pulmonary effort is normal.     Breath sounds: Normal breath sounds.  Abdominal:     Palpations: Abdomen is soft. There is no pulsatile mass.     Tenderness: There is no abdominal tenderness.  Musculoskeletal:     Right lower leg: No edema.     Left lower leg: No edema.  Skin:    General: Skin is warm and dry.  Neurological:     Mental Status: She is alert and oriented to person, place, and time.  Psychiatric:        Mood and Affect: Mood normal.        Behavior: Behavior normal.        Assessment & Plan:  KAMIAH FITE is a 62 y.o. female . Type 2 diabetes mellitus with hyperglycemia, without long-term current use of insulin (HCC) - Plan: Hemoglobin A1c, Comprehensive metabolic panel -Check A1c, then likely adjustment of meds if A1c over 7.  Option of GLP-1, GLP-1-GIP, or SGLT2.  If we do decide to go GLP-1 would stop Januvia.  Potential side effects and risks of each were discussed.  She will think about options.  Continue metformin, Januvia same doses for now.  Commended on dietary and exercise changes.  Hyperlipidemia associated with type 2 diabetes mellitus (HCC) - Plan: pravastatin (PRAVACHOL) 20 MG tablet, Comprehensive  metabolic panel, Lipid panel  -Improved tolerability of pravastatin, still with intermittent myalgias of forearms, knees, unlikely related to statin.  Check CPK, check lipids and then adjust dose of  pravastatin accordingly.  Currently 2 times per week.  Myalgia - Plan: CK As above.  Okay to continue co-Q10, if we do decide to increase statin dosing monitor for worsening symptoms  Need for diphtheria-tetanus-pertussis (Tdap) vaccine - Plan: Tdap vaccine greater than or equal to 7yo IM given.   Meds ordered this encounter  Medications   pravastatin (PRAVACHOL) 20 MG tablet    Sig: Take 1 tablet (20 mg total) by mouth daily.    Dispense:  30 tablet    Refill:  1   Patient Instructions  Depending on A1c, can consider GLP1 injection or SGLT2 pill. Let me know if you have a preference, but we can discuss further once we see your A1c.   Glad to hear the pravastatin has been better tolerated.  I will check your lipids today and if those are still elevated can increase to a few more days per week if still tolerated.  I am also checking a muscle enzyme test.  Recheck in 3 months, but let me know if there are questions in the meantime.  Thanks for coming in today.    Signed,   Meredith Staggers, MD [facility], [facility] 02/18/23 9:26 AM

## 2023-02-24 ENCOUNTER — Other Ambulatory Visit (HOSPITAL_COMMUNITY): Payer: Self-pay

## 2023-02-25 ENCOUNTER — Other Ambulatory Visit (HOSPITAL_COMMUNITY): Payer: Self-pay

## 2023-02-25 ENCOUNTER — Other Ambulatory Visit: Payer: Self-pay

## 2023-02-25 MED ORDER — METFORMIN HCL 1000 MG PO TABS
1000.0000 mg | ORAL_TABLET | Freq: Two times a day (BID) | ORAL | 3 refills | Status: DC
  Filled 2023-02-25: qty 180, 90d supply, fill #0
  Filled 2023-05-21: qty 180, 90d supply, fill #1

## 2023-02-25 NOTE — Telephone Encounter (Signed)
Patient would like to forego new meds but would be willing to increase metformin dose. Please advise

## 2023-03-04 ENCOUNTER — Ambulatory Visit (HOSPITAL_COMMUNITY)
Admission: RE | Admit: 2023-03-04 | Discharge: 2023-03-04 | Disposition: A | Discharge status: Home / Self Care | Payer: 59 | Source: Ambulatory Visit | Attending: Family Medicine | Admitting: Family Medicine

## 2023-03-04 DIAGNOSIS — Z1231 Encounter for screening mammogram for malignant neoplasm of breast: Secondary | ICD-10-CM | POA: Diagnosis not present

## 2023-03-30 ENCOUNTER — Other Ambulatory Visit (HOSPITAL_COMMUNITY): Payer: Self-pay

## 2023-04-19 ENCOUNTER — Other Ambulatory Visit: Payer: Self-pay | Admitting: Obstetrics & Gynecology

## 2023-04-21 ENCOUNTER — Other Ambulatory Visit: Payer: Self-pay

## 2023-04-21 MED ORDER — PROGESTERONE 200 MG PO CAPS
200.0000 mg | ORAL_CAPSULE | Freq: Every day | ORAL | 3 refills | Status: DC
  Filled 2023-04-21: qty 90, 90d supply, fill #0
  Filled 2023-07-27: qty 90, 90d supply, fill #1
  Filled 2023-10-24: qty 90, 90d supply, fill #2
  Filled 2024-01-19: qty 90, 90d supply, fill #3

## 2023-04-21 MED ORDER — ESTRADIOL 1 MG PO TABS
1.0000 mg | ORAL_TABLET | Freq: Every day | ORAL | 0 refills | Status: DC
  Filled 2023-04-21: qty 90, 90d supply, fill #0
  Filled 2023-07-27: qty 90, 90d supply, fill #1
  Filled 2023-10-24: qty 90, 90d supply, fill #2
  Filled 2024-01-19: qty 90, 90d supply, fill #3

## 2023-04-27 ENCOUNTER — Other Ambulatory Visit: Payer: Self-pay

## 2023-04-29 ENCOUNTER — Encounter: Payer: Self-pay | Admitting: Adult Health

## 2023-04-29 ENCOUNTER — Ambulatory Visit: Payer: 59 | Admitting: Adult Health

## 2023-04-29 ENCOUNTER — Other Ambulatory Visit: Payer: Self-pay

## 2023-04-29 ENCOUNTER — Other Ambulatory Visit (HOSPITAL_COMMUNITY)
Admission: RE | Admit: 2023-04-29 | Discharge: 2023-04-29 | Disposition: A | Discharge status: Home / Self Care | Payer: 59 | Source: Ambulatory Visit | Attending: Adult Health | Admitting: Adult Health

## 2023-04-29 VITALS — BP 119/78 | HR 73 | Ht 66.0 in | Wt 182.0 lb

## 2023-04-29 DIAGNOSIS — Z1211 Encounter for screening for malignant neoplasm of colon: Secondary | ICD-10-CM | POA: Diagnosis not present

## 2023-04-29 DIAGNOSIS — Z01419 Encounter for gynecological examination (general) (routine) without abnormal findings: Secondary | ICD-10-CM | POA: Insufficient documentation

## 2023-04-29 DIAGNOSIS — Z79890 Hormone replacement therapy (postmenopausal): Secondary | ICD-10-CM | POA: Diagnosis not present

## 2023-04-29 DIAGNOSIS — Z1331 Encounter for screening for depression: Secondary | ICD-10-CM

## 2023-04-29 LAB — HEMOCCULT GUIAC POC 1CARD (OFFICE): Fecal Occult Blood, POC: NEGATIVE

## 2023-04-29 MED ORDER — CLOBETASOL PROPIONATE 0.05 % EX OINT
1.0000 | TOPICAL_OINTMENT | Freq: Two times a day (BID) | CUTANEOUS | 3 refills | Status: DC
  Filled 2023-04-29 – 2023-05-05 (×2): qty 30, 15d supply, fill #0
  Filled 2023-05-15 – 2023-05-21 (×2): qty 30, 15d supply, fill #1
  Filled 2023-06-24: qty 30, 15d supply, fill #2
  Filled 2023-07-27: qty 30, 15d supply, fill #3

## 2023-04-29 NOTE — Progress Notes (Signed)
Patient ID: Jenna Carroll, female   DOB: 28-Nov-1960, 62 y.o.   MRN: [id number] History of Present Illness: Jenna Carroll is a 62 year old white female, married, PM in for gyn exam and pap. 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  Has physical and labs with PCP.  PCP is Dr Neva Seat   Current Medications, Allergies, Past Medical History, Past Surgical History, Family History and Social History were reviewed in [employer] electronic medical record.     Review of Systems: Patient denies any headaches, hearing loss, fatigue, blurred vision, shortness of breath, chest pain, abdominal pain, problems with bowel movements, urination, or intercourse. No joint pain or mood swings.  Denies any vaginal bleeding   Physical Exam:BP 119/78 (BP Location: Left Arm, Patient Position: Sitting, Cuff Size: Normal)   Pulse 73   Ht 5\' 6"  (1.676 m)   Wt 182 lb (82.6 kg)   LMP 11/07/2015   BMI 29.38 kg/m   General:  Well developed, well nourished, no acute distress Skin:  Warm and dry Lungs; Clear to auscultation bilaterally Breast:  No dominant palpable mass, retraction, or nipple discharge Cardiovascular: Regular rate and rhythm Pelvic:  External genitalia is normal in appearance, no lesions.  The vagina is normal in appearance. Urethra has no lesions or masses. The cervix is smooth, pap with HR HPV genotyping performed.  Uterus is felt to be normal size, shape, and contour.  No adnexal masses or tenderness noted.Bladder is non tender, no masses felt. Rectal: Good sphincter tone, no polyps, or hemorrhoids felt.  Hemoccult negative. Extremities/musculoskeletal:  No swelling or varicosities noted, no clubbing or cyanosis Psych:  No mood changes, alert and cooperative,seems happy AA is 2 Fall risk is low    04/29/2023    8:33 AM 02/18/2023    8:51 AM 11/12/2022    8:45 AM  Depression screen PHQ 2/9  Decreased Interest 0 0 0  Down, Depressed, Hopeless 1 0 1  PHQ - 2 Score  1 0 1  Altered sleeping 1 1 1   Tired, decreased energy 1 1 1   Change in appetite 1 1 1   Feeling bad or failure about yourself  0 0 0  Trouble concentrating 0 0 0  Moving slowly or fidgety/restless 0 0 0  Suicidal thoughts 0 0 0  PHQ-9 Score 4 3 4   Difficult doing work/chores  Not difficult at all        04/29/2023    8:33 AM 02/18/2023    8:51 AM 11/12/2022    8:45 AM 08/06/2022    8:45 AM  GAD 7 : Generalized Anxiety Score  Nervous, Anxious, on Edge 1 1 1  0  Control/stop worrying 0 0 0 0  Worry too much - different things 0 0 0 0  Trouble relaxing 0 0 0 1  Restless 0 0 0 0  Easily annoyed or irritable 0 0 0 1  Afraid - awful might happen 0 0 0 1  Total GAD 7 Score 1 1 1 3   Anxiety Difficulty  Not difficult at all      Upstream - 04/29/23 0839       Pregnancy Intention Screening   Does the patient want to become pregnant in the next year? N/A    Does the patient's partner want to become pregnant in the next year? N/A    Would the patient like to discuss contraceptive options today? N/A      Contraception Wrap Up  Current Method No Method - Other Reason   postmenopausal   Reason for No Current Contraceptive Method at Intake ([facility] Only) Other   postmenopausal   Contraception Counseling Provided No            Examination chaperoned by Freddie Apley RN     Impression and plan: 1. Encounter for routine gynecological examination with Papanicolaou smear of cervix Pap sent Pap in 3 years if normal Mammogram was normal 101/6/24 Colonoscopy advised  - Cytology - PAP( [facility]) Physical with and labs with PCP  2. Encounter for screening fecal occult blood testing Hemoccult was negative  - POCT occult blood stool  3. Hormone replacement therapy Happy with HRT, has refills on Estrace 1 mg daily and Prometrium 200 mg 1 daily  She requests refill on temovate Meds ordered this encounter  Medications   clobetasol ointment (TEMOVATE) 0.05 %    Sig: Apply 1  topically 2 (two) times daily.    Dispense:  30 g    Refill:  3    Order Specific Question:   Supervising Provider    Answer:   Duane Lope H [[id number]]

## 2023-04-30 LAB — CYTOLOGY - PAP
Adequacy: ABSENT
Comment: NEGATIVE
Diagnosis: NEGATIVE
High risk HPV: NEGATIVE

## 2023-05-04 ENCOUNTER — Other Ambulatory Visit: Payer: Self-pay

## 2023-05-05 ENCOUNTER — Other Ambulatory Visit: Payer: Self-pay

## 2023-05-05 ENCOUNTER — Other Ambulatory Visit: Payer: Self-pay | Admitting: Family Medicine

## 2023-05-05 ENCOUNTER — Other Ambulatory Visit (HOSPITAL_COMMUNITY): Payer: Self-pay

## 2023-05-05 DIAGNOSIS — E1165 Type 2 diabetes mellitus with hyperglycemia: Secondary | ICD-10-CM

## 2023-05-05 DIAGNOSIS — E039 Hypothyroidism, unspecified: Secondary | ICD-10-CM

## 2023-05-05 MED ORDER — SITAGLIPTIN PHOSPHATE 100 MG PO TABS
100.0000 mg | ORAL_TABLET | Freq: Every day | ORAL | 1 refills | Status: DC
  Filled 2023-05-05: qty 90, 90d supply, fill #0
  Filled 2023-05-25 – 2023-05-29 (×2): qty 30, 30d supply, fill #0
  Filled 2023-06-24: qty 30, 30d supply, fill #1
  Filled 2023-07-27: qty 30, 30d supply, fill #2

## 2023-05-05 MED ORDER — LEVOTHYROXINE SODIUM 100 MCG PO TABS
100.0000 ug | ORAL_TABLET | Freq: Every day | ORAL | 1 refills | Status: DC
  Filled 2023-05-05: qty 90, 90d supply, fill #0

## 2023-05-06 ENCOUNTER — Other Ambulatory Visit (HOSPITAL_COMMUNITY): Payer: Self-pay

## 2023-05-15 ENCOUNTER — Other Ambulatory Visit: Payer: Self-pay

## 2023-05-21 ENCOUNTER — Other Ambulatory Visit (HOSPITAL_BASED_OUTPATIENT_CLINIC_OR_DEPARTMENT_OTHER): Payer: Self-pay

## 2023-05-25 ENCOUNTER — Other Ambulatory Visit: Payer: Self-pay

## 2023-05-25 ENCOUNTER — Other Ambulatory Visit: Payer: Self-pay | Admitting: Family Medicine

## 2023-05-25 ENCOUNTER — Encounter: Payer: Self-pay | Admitting: Pharmacist

## 2023-05-25 ENCOUNTER — Other Ambulatory Visit (HOSPITAL_COMMUNITY): Payer: Self-pay

## 2023-05-25 DIAGNOSIS — E1169 Type 2 diabetes mellitus with other specified complication: Secondary | ICD-10-CM

## 2023-05-25 MED ORDER — PRAVASTATIN SODIUM 20 MG PO TABS
20.0000 mg | ORAL_TABLET | Freq: Every day | ORAL | 1 refills | Status: DC
  Filled 2023-05-25: qty 30, 30d supply, fill #0
  Filled 2023-06-24: qty 30, 30d supply, fill #1

## 2023-05-27 ENCOUNTER — Encounter: Payer: 59 | Admitting: Family Medicine

## 2023-05-28 ENCOUNTER — Other Ambulatory Visit: Payer: Self-pay

## 2023-05-29 ENCOUNTER — Other Ambulatory Visit (HOSPITAL_COMMUNITY): Payer: Self-pay

## 2023-06-02 IMAGING — MG MM DIGITAL SCREENING BILAT W/ TOMO AND CAD
8 series · 8 of 24 positions shown · non-contrast
Comparison: Previous exam(s).

CLINICAL DATA: Screening.

EXAM:
DIGITAL SCREENING BILATERAL MAMMOGRAM WITH TOMOSYNTHESIS AND CAD
TECHNIQUE: Bilateral screening digital craniocaudal and mediolateral oblique
mammograms were obtained. Bilateral screening digital breast
tomosynthesis was performed. The images were evaluated with
computer-aided detection.

[L CC synth-2D]
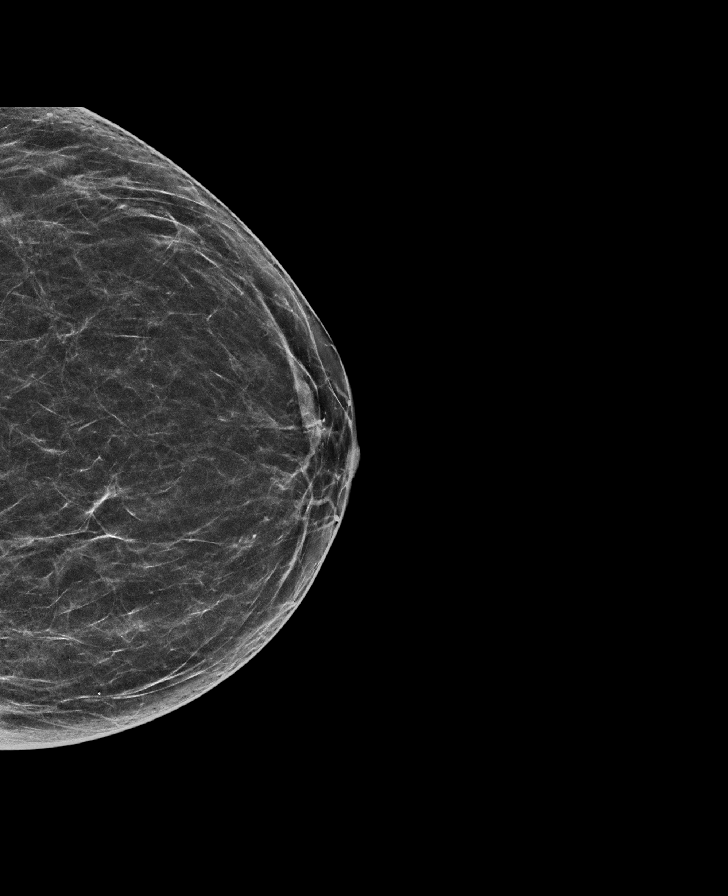

[R CC synth-2D]
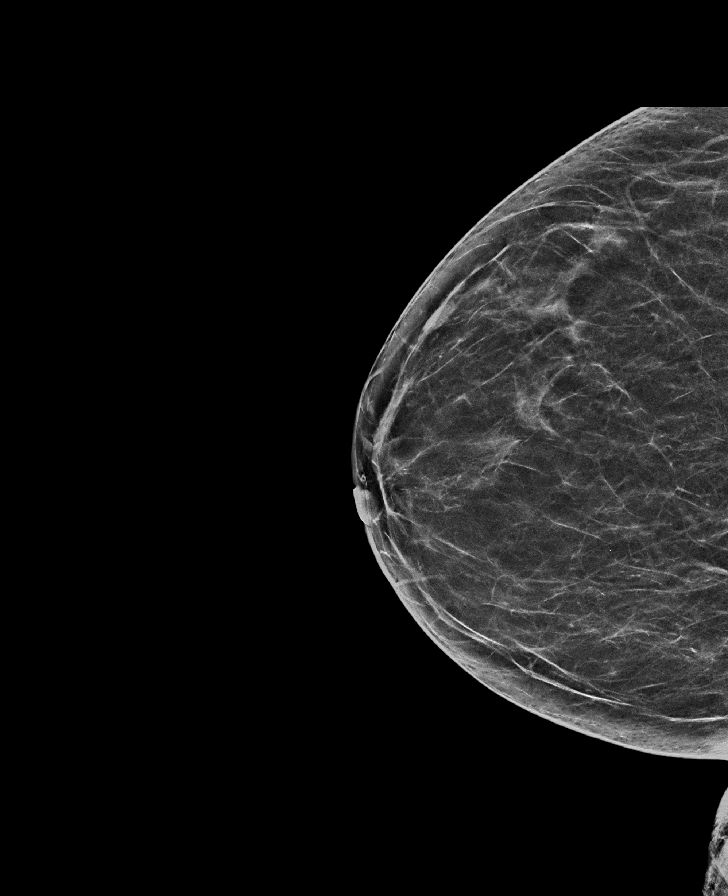

[L MLO synth-2D]
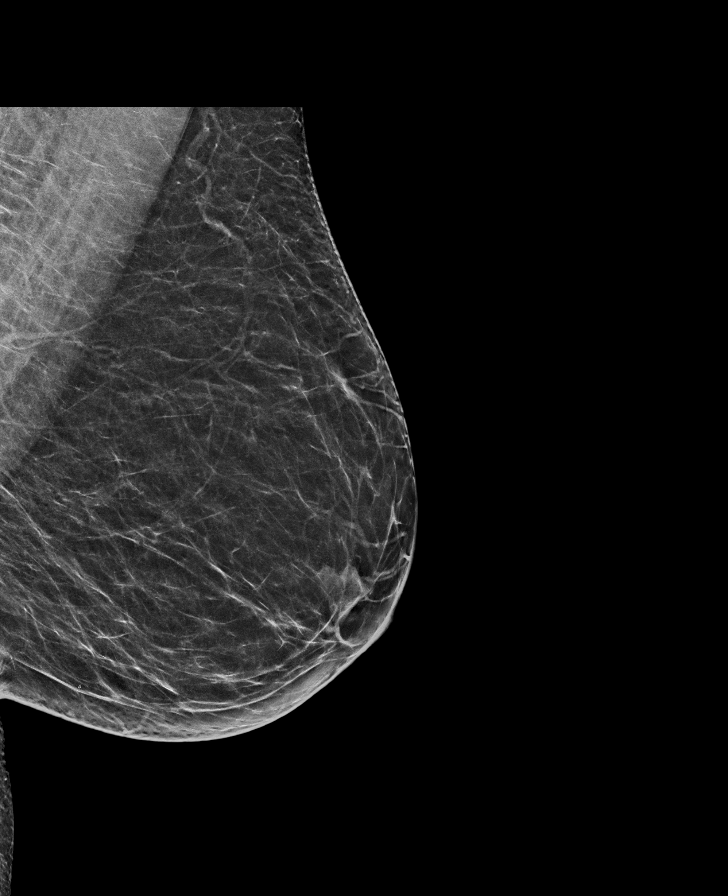

[R MLO synth-2D]
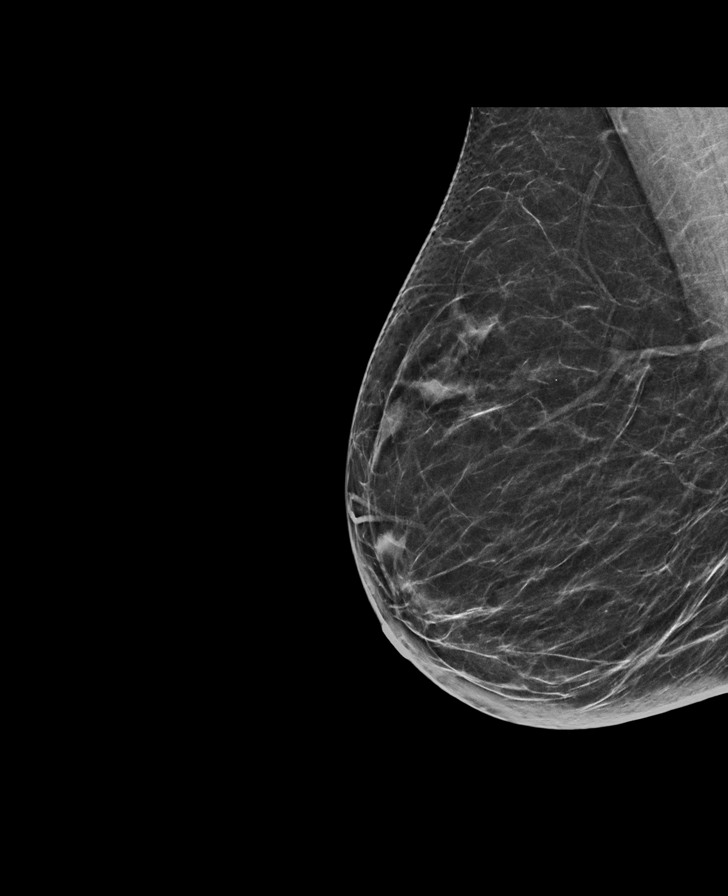

[R MLO tomo · tomo slice 33/66.0]
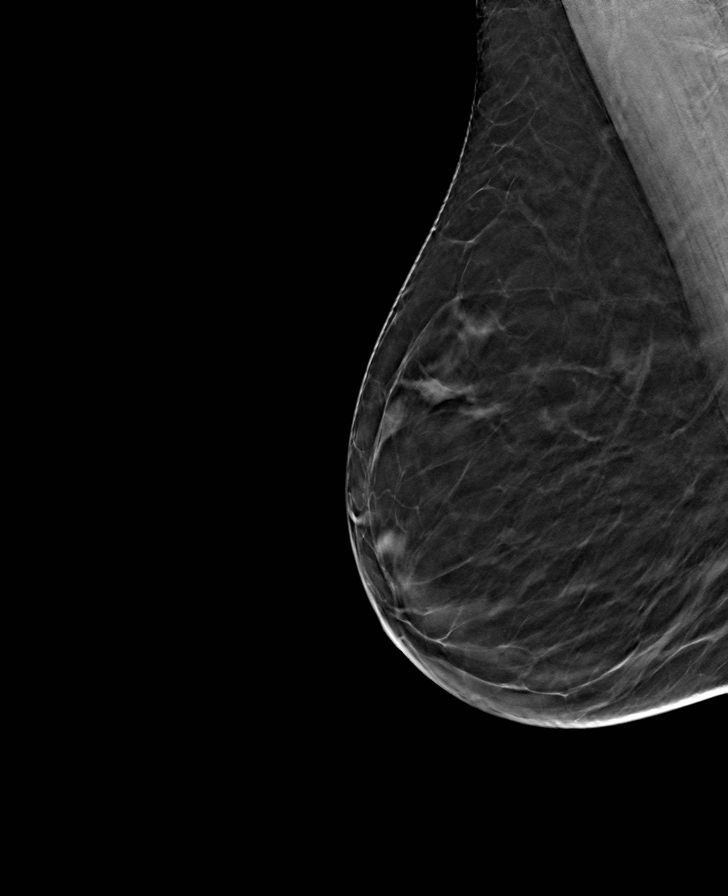

[R CC tomo · tomo slice 31/62.0]
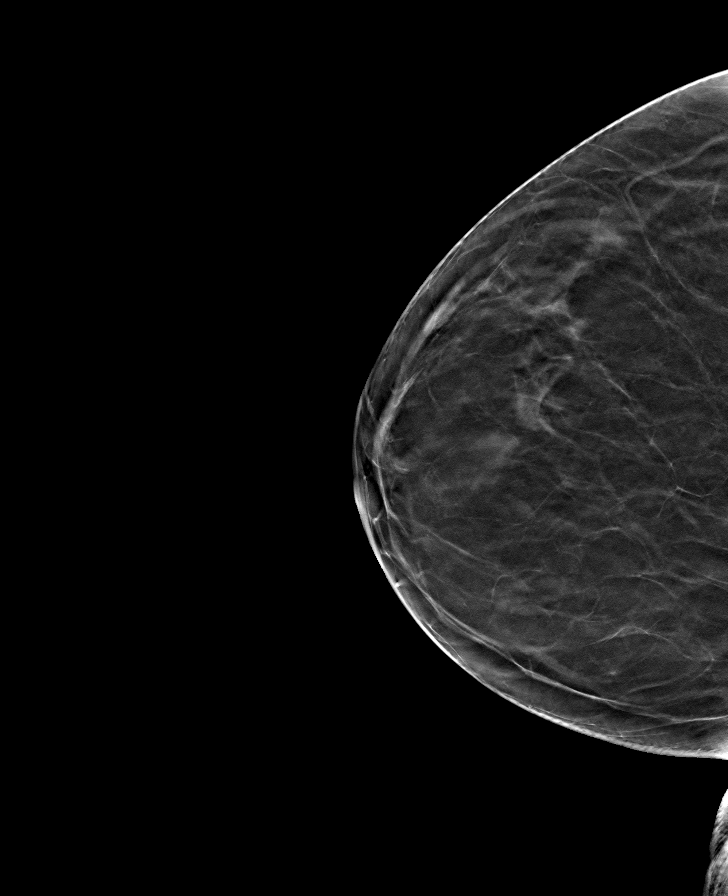

[L CC tomo · tomo slice 31/61.0]
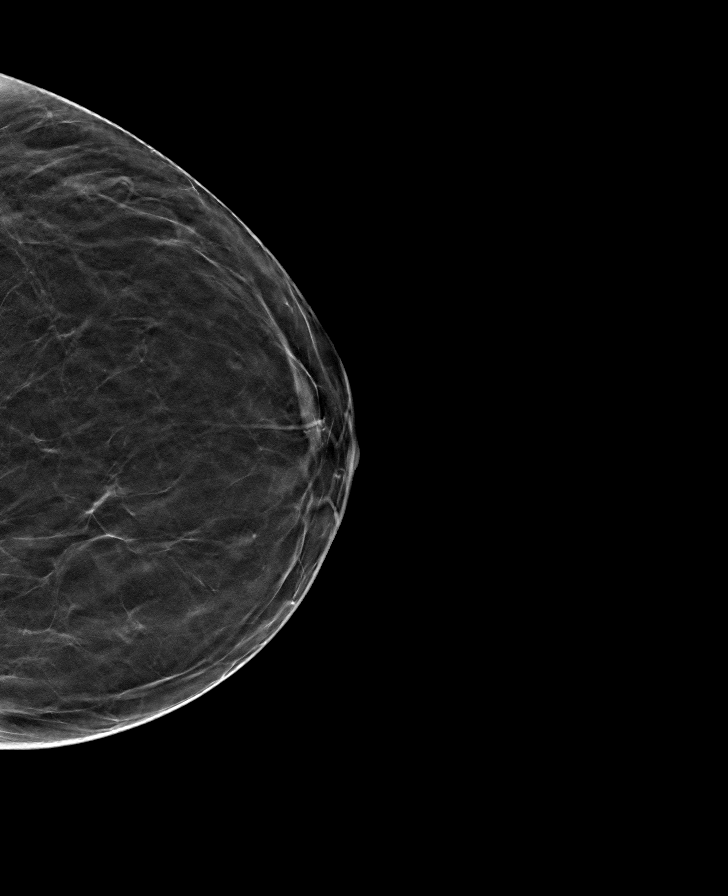

[L MLO tomo · tomo slice 35/68.0]
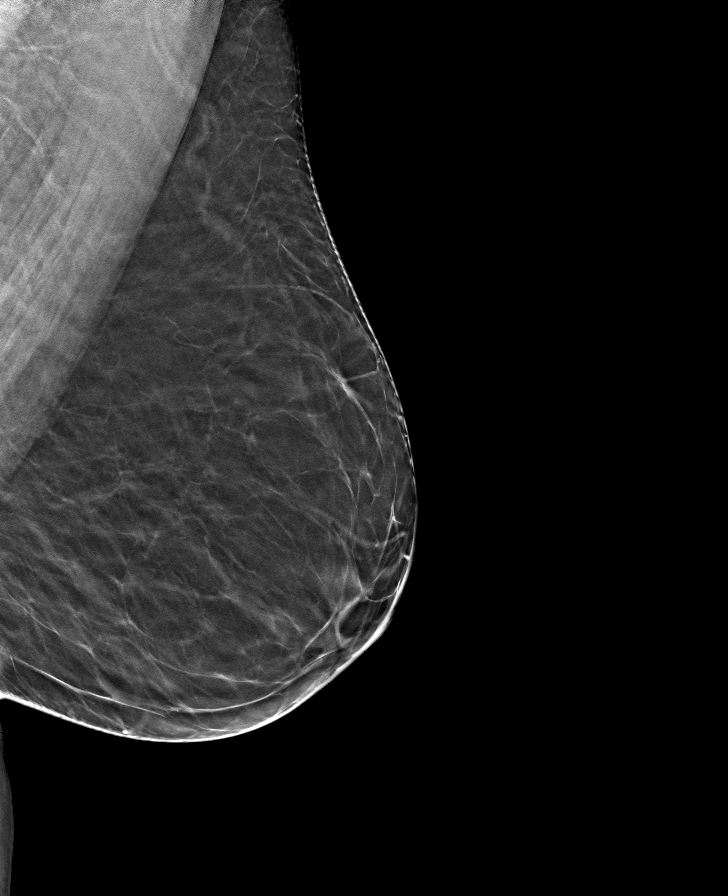

[8 of 24 positions shown; findings below may reference images not displayed]

ACR Breast Density Category b: There are scattered areas of
fibroglandular density.
FINDINGS: There are no findings suspicious for malignancy.
IMPRESSION: No mammographic evidence of malignancy. A result letter of this
screening mammogram will be mailed directly to the patient.

RECOMMENDATION:
Screening mammogram in one year. (Code:51-O-LD2)

BI-RADS CATEGORY  1: Negative.

## 2023-06-24 ENCOUNTER — Other Ambulatory Visit: Payer: Self-pay

## 2023-07-22 DIAGNOSIS — H5203 Hypermetropia, bilateral: Secondary | ICD-10-CM | POA: Diagnosis not present

## 2023-07-22 LAB — OPHTHALMOLOGY REPORT-SCANNED

## 2023-07-27 ENCOUNTER — Other Ambulatory Visit (HOSPITAL_COMMUNITY): Payer: Self-pay

## 2023-07-27 ENCOUNTER — Encounter: Payer: Self-pay | Admitting: Family Medicine

## 2023-07-27 ENCOUNTER — Other Ambulatory Visit: Payer: Self-pay | Admitting: Family Medicine

## 2023-07-27 ENCOUNTER — Ambulatory Visit: Payer: 59 | Admitting: Family Medicine

## 2023-07-27 ENCOUNTER — Other Ambulatory Visit: Payer: Self-pay

## 2023-07-27 VITALS — BP 124/68 | HR 60 | Temp 98.0°F | Ht 66.75 in | Wt 181.8 lb

## 2023-07-27 DIAGNOSIS — E039 Hypothyroidism, unspecified: Secondary | ICD-10-CM | POA: Diagnosis not present

## 2023-07-27 DIAGNOSIS — Z7984 Long term (current) use of oral hypoglycemic drugs: Secondary | ICD-10-CM

## 2023-07-27 DIAGNOSIS — Z0000 Encounter for general adult medical examination without abnormal findings: Secondary | ICD-10-CM | POA: Diagnosis not present

## 2023-07-27 DIAGNOSIS — E785 Hyperlipidemia, unspecified: Secondary | ICD-10-CM | POA: Diagnosis not present

## 2023-07-27 DIAGNOSIS — E1165 Type 2 diabetes mellitus with hyperglycemia: Secondary | ICD-10-CM

## 2023-07-27 DIAGNOSIS — E1169 Type 2 diabetes mellitus with other specified complication: Secondary | ICD-10-CM

## 2023-07-27 DIAGNOSIS — Z1211 Encounter for screening for malignant neoplasm of colon: Secondary | ICD-10-CM

## 2023-07-27 MED ORDER — METFORMIN HCL 1000 MG PO TABS
1000.0000 mg | ORAL_TABLET | Freq: Two times a day (BID) | ORAL | 3 refills | Status: DC
  Filled 2023-07-27: qty 180, 90d supply, fill #0
  Filled 2023-11-26: qty 180, 90d supply, fill #1

## 2023-07-27 MED ORDER — SITAGLIPTIN PHOSPHATE 100 MG PO TABS
100.0000 mg | ORAL_TABLET | Freq: Every day | ORAL | 1 refills | Status: DC
Start: 2023-07-27 — End: 2023-11-11
  Filled 2023-07-27: qty 90, 90d supply, fill #0
  Filled 2023-10-24: qty 90, 90d supply, fill #1

## 2023-07-27 MED ORDER — LEVOTHYROXINE SODIUM 100 MCG PO TABS
100.0000 ug | ORAL_TABLET | Freq: Every day | ORAL | 1 refills | Status: DC
  Filled 2023-07-27: qty 90, 90d supply, fill #0
  Filled 2023-11-02: qty 90, 90d supply, fill #1

## 2023-07-27 MED ORDER — PRAVASTATIN SODIUM 20 MG PO TABS
20.0000 mg | ORAL_TABLET | Freq: Every day | ORAL | 1 refills | Status: DC
  Filled 2023-07-27: qty 30, 30d supply, fill #0
  Filled 2023-08-23: qty 30, 30d supply, fill #1

## 2023-07-27 NOTE — Progress Notes (Unsigned)
 Subjective:  Patient ID: Jenna Carroll, female    DOB: [birth year]  Age: 63 y.o. MRN: [id number]  CC:  Chief Complaint  Patient presents with   Annual Exam    Pt is well, not fasting   Health Maintenance    Pt would like to pass on HIV testing at this time, did not have a COVID-19 Vaccine in 2024, has a podiatrist for foot care, agrees to colonoscopy and requests [facility], would like to discuss pneumonia vaccine.      HPI Jenna Carroll presents for Annual Exam No health changes. Still taking care of family.  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 Father in law is 61, mother and father almost 58. Happy/sad time of life.   PCP, me GYN, Jenna Mourning, NP, [facility], negative Pap with negative HPV on 04/29/2023 Dermatology, Dr. Doreen Carroll   Podiatry, Jenna Carroll - last OV in October.   Diabetes: With hyperglycemia. Treated with metformin 1000mg  BID, Januvia 100mg  every day.  Some difficulty tolerating statin previously.  Pravastatin 2 days/week with minimal myalgias, better than Lipitor when discussed in October.  Co-Q10 use at that time. 3-4 days per week. Min muscle aches.  Home readings: 130-140's. no low sx's.  Microalbumin ratio normal 11/12/22  Optho, foot exam, pneumovax:   No FH of MEN syndrome, medullary thyroid CA, would consider SGLT2 over GLP if additional med needed.   Lab Results  Component Value Date   HGBA1C 8.0 (H) 02/18/2023   HGBA1C 7.7 (H) 11/12/2022   HGBA1C 7.4 (H) 08/06/2022   Lab Results  Component Value Date   MICROALBUR <0.7 11/12/2022   LDLCALC 130 (H) 02/18/2023   CREATININE 0.87 02/18/2023   Hypothyroidism: Lab Results  Component Value Date   TSH 2.39 11/12/2022  Synthroid every day.  Taking medication daily.  No new hot intolerance. Cold at times and some hair loss at times. No  palpitations or new fatigue. No new weight changes.       07/27/2023    1:32 PM  04/29/2023    8:33 AM 02/18/2023    8:51 AM 11/12/2022    8:45 AM 08/06/2022    8:44 AM  Depression screen PHQ 2/9  Decreased Interest 0 0 0 0 1  Down, Depressed, Hopeless 0 1 0 1 0  PHQ - 2 Score 0 1 0 1 1  Altered sleeping 1 1 1 1 2   Tired, decreased energy 1 1 1 1 3   Change in appetite 1 1 1 1 1   Feeling bad or failure about yourself  0 0 0 0 0  Trouble concentrating 0 0 0 0 2  Moving slowly or fidgety/restless 0 0 0 0 0  Suicidal thoughts 0 0 0 0 0  PHQ-9 Score 3 4 3 4 9   Difficult doing work/chores   Not difficult at all      Health Maintenance  Topic Date Due   HIV Screening  Never done   Colonoscopy  06/25/2016   Pneumococcal Vaccine 61-40 Years old (2 of 2 - PCV) 09/24/2018   FOOT EXAM  07/25/2022   COVID-19 Vaccine (4 - 2024-25 season) 01/18/2023   HEMOGLOBIN A1C  08/19/2023   Diabetic kidney evaluation - Urine ACR  11/12/2023   OPHTHALMOLOGY EXAM  11/26/2023   Diabetic kidney evaluation - eGFR measurement  02/18/2024   COLON CANCER SCREENING ANNUAL FOBT  04/28/2024   MAMMOGRAM  03/03/2025  Cervical Cancer Screening (HPV/Pap Cotest)  04/28/2028   DTaP/Tdap/Td (3 - Td or Tdap) 02/17/2033   INFLUENZA VACCINE  Completed   Hepatitis C Screening  Completed   Zoster Vaccines- Shingrix  Completed   HPV VACCINES  Aged Out  Colonoscopy: needs referral.  Mammogram 03/04/23 Pap testing in 04/2023.    Immunization History  Administered Date(s) Administered   Influenza,inj,Quad PF,6+ Mos 02/16/2019, 02/05/2022   Influenza-Unspecified 02/21/2020, 03/07/2021, 02/09/2023   Moderna SARS-COV2 Booster Vaccination 03/26/2020   Moderna Sars-Covid-2 Vaccination 05/17/2019, 06/13/2019, 03/26/2020, 09/28/2020   Pneumococcal Polysaccharide-23 09/23/2017   Tdap 05/17/2012, 02/18/2023   Zoster Recombinant(Shingrix) 10/30/2021, 02/05/2022  Defers PNA vaccine booster.  Defers covid booster.   No results found. Optho - last week.   Dental: March 26th.   Alcohol: rare  Tobacco:  none.   Exercise: less exercise recently - plans to increase.   History Patient Active Problem List   Diagnosis Date Noted   Encounter for routine gynecological examination with Papanicolaou smear of cervix 04/29/2023   Hyperlipidemia associated with type 2 diabetes mellitus (HCC) 04/24/2021   Hormone replacement therapy 04/04/2021   Postmenopause 04/04/2021   Encounter for screening fecal occult blood testing 04/04/2021   Type 2 diabetes mellitus with hyperglycemia, without long-term current use of insulin (HCC) 04/04/2021   Encounter for colorectal cancer screening 10/25/2019   Lichen sclerosus et atrophicus 10/25/2019   Screening for colorectal cancer 02/24/2018   Encounter for well woman exam with routine gynecological exam 02/24/2018   Encounter for gynecological examination with Papanicolaou smear of cervix 04/08/2016   IUD (intrauterine device) in place 04/08/2016   Diabetes mellitus type 2, diet-controlled (HCC) 09/29/2012   Microalbuminuria 09/29/2012   UTI (urinary tract infection) 09/29/2012   Skin mole 09/29/2012   Hypothyroidism 03/16/2012   Arthritis 03/16/2012   Vitamin D deficiency 03/16/2012   Past Medical History:  Diagnosis Date   Arthritis    DM (diabetes mellitus) (HCC) 02/17/2012   Hyperlipidemia, mild    Hypothyroidism    Thyroid disease    Past Surgical History:  Procedure Laterality Date   left ankle     TONSILLECTOMY AND ADENOIDECTOMY     WISDOM TOOTH EXTRACTION     Allergies  Allergen Reactions   Lipitor [Atorvastatin] Other (See Comments)    Arthralgia/myalgia.    Prior to Admission medications   Medication Sig Start Date End Date Taking? Authorizing Provider  Cholecalciferol (VITAMIN D-3) 125 MCG (5000 UT) TABS Take 1 tablet by mouth daily.   Yes [provider]  clobetasol ointment (TEMOVATE) 0.05 % Apply 1 topically 2 (two) times daily. 04/29/23  Yes Jenna Carroll A, NP  Coenzyme Q10 (COQ10 PO) Take 300 mg by mouth daily.    Yes [provider]  Cyanocobalamin (VITAMIN B-12 PO) Take 5,000 mcg by mouth daily.   Yes [provider]  estradiol (ESTRACE) 1 MG tablet Take 1 tablet (1 mg total) by mouth daily. 04/21/23 04/20/24 Yes Jenna Arms, MD  levothyroxine (SYNTHROID) 100 MCG tablet Take 1 tablet (100 mcg total) by mouth daily. 05/05/23  Yes Shade Flood, MD  MAGNESIUM GLUCONATE PO Take 300 mg by mouth daily.   Yes [provider]  metFORMIN (GLUCOPHAGE) 1000 MG tablet Take 1 tablet (1,000 mg total) by mouth 2 (two) times daily with a meal. 02/25/23  Yes Shade Flood, MD  pravastatin (PRAVACHOL) 20 MG tablet Take 1 tablet (20 mg total) by mouth daily. 05/25/23  Yes Shade Flood, MD  progesterone [facility])  200 MG capsule Take 1 capsule (200 mg total) by mouth daily. 04/21/23  Yes Jenna Arms, MD  sitaGLIPtin (JANUVIA) 100 MG tablet Take 1 tablet (100 mg total) by mouth daily. 05/05/23  Yes Shade Flood, MD  sodium fluoride (SODIUM FLUORIDE 5000 PPM) 1.1 % GEL dental gel Apply 1 time per day at night; Apply pea-sized amount to toothbrush. Brush a full three mintues. Spit out excess. Do not rinse following use 07/16/22  Yes Sharman Cheek, DMD  Naproxen Sodium (ALEVE PO) Take by mouth as needed. Patient not taking: Reported on 07/27/2023    [provider]   Social History   Socioeconomic History   Marital status: Married    Spouse name: Not on file   Number of children: 0   Years of education: Not on file   Highest education level: Not on file  Occupational History   Not on file  Tobacco Use   Smoking status: Never   Smokeless tobacco: Never  Vaping Use   Vaping status: Never Used  Substance and Sexual Activity   Alcohol use: Yes    Comment: occ   Drug use: No   Sexual activity: Yes    Birth control/protection: Post-menopausal  Other Topics Concern   Not on file  Social History Narrative   Married for 37 years.Works as Games developer at  [employer].   Social Drivers of [employer]: Low Risk  (04/29/2023)   Overall Financial Resource Strain (CARDIA)    Difficulty of Paying Living Expenses: Not hard at all  Food Insecurity: No Food Insecurity (04/29/2023)   Hunger Vital Sign    Worried About Running Out of Food in the Last Year: Never true    Ran Out of Food in the Last Year: Never true  Transportation Needs: No Transportation Needs (04/29/2023)   PRAPARE - Administrator, Civil Service (Medical): No    Lack of Transportation (Non-Medical): No  Physical Activity: Insufficiently Active (04/29/2023)   Exercise Vital Sign    Days of Exercise per Week: 4 days    Minutes of Exercise per Session: 30 min  Stress: No Stress Concern Present (04/29/2023)   [employer] of Occupational Health - Occupational Stress Questionnaire    Feeling of Stress : Only a little  Social Connections: Socially Integrated (04/29/2023)   Social Connection and Isolation Panel [NHANES]    Frequency of Communication with Friends and Family: Three times a week    Frequency of Social Gatherings with Friends and Family: Once a week    Attends Religious Services: More than 4 times per year    Active Member of [employer] or Organizations: Yes    Attends Banker Meetings: 1 to 4 times per year    Marital Status: Married  Catering manager Violence: Unknown (04/29/2023)   Humiliation, Afraid, Rape, and Kick questionnaire    Fear of Current or Ex-Partner: No    Emotionally Abused: No    Physically Abused: No    Sexually Abused: Not on file    Review of Systems 13 point review of systems per patient health survey noted.  Negative other than as indicated above or in HPI.      Objective:   Vitals:   07/27/23 1336  BP: 124/68  Pulse: 60  Temp: 98 F (36.7 C)  TempSrc: Temporal  SpO2: 95%  Weight: 181 lb 12.8 oz (82.5 kg)  Height: 5' 6.75" (1.695 m)   {Vitals History (Optional):23777}  Physical  Exam Constitutional:      Appearance: She is well-developed.  HENT:     Head: Normocephalic and atraumatic.     Right Ear: External ear normal.     Left Ear: External ear normal.  Eyes:     Conjunctiva/sclera: Conjunctivae normal.     Pupils: Pupils are equal, round, and reactive to light.  Neck:     Thyroid: No thyromegaly.  Cardiovascular:     Rate and Rhythm: Normal rate and regular rhythm.     Heart sounds: Normal heart sounds. No murmur heard. Pulmonary:     Effort: Pulmonary effort is normal. No respiratory distress.     Breath sounds: Normal breath sounds. No wheezing.  Abdominal:     General: Bowel sounds are normal.     Palpations: Abdomen is soft.     Tenderness: There is no abdominal tenderness.  Musculoskeletal:        General: No tenderness. Normal range of motion.     Cervical back: Normal range of motion and neck supple.  Lymphadenopathy:     Cervical: No cervical adenopathy.  Skin:    General: Skin is warm and dry.     Findings: No rash.  Neurological:     Mental Status: She is alert and oriented to person, place, and time.  Psychiatric:        Behavior: Behavior normal.        Thought Content: Thought content normal.        Assessment & Plan:  Jenna Carroll is a 63 y.o. female . Annual physical exam - Plan: CBC with Differential/Platelet, Comprehensive metabolic panel, Lipid panel, TSH, Hemoglobin A1c  Type 2 diabetes mellitus with hyperglycemia, without long-term current use of insulin (HCC) - Plan: CBC with Differential/Platelet, Comprehensive metabolic panel, Hemoglobin A1c, metFORMIN (GLUCOPHAGE) 1000 MG tablet, sitaGLIPtin (JANUVIA) 100 MG tablet  Hyperlipidemia associated with type 2 diabetes mellitus (HCC) - Plan: CBC with Differential/Platelet, Comprehensive metabolic panel, Lipid panel, pravastatin (PRAVACHOL) 20 MG tablet  Hypothyroidism, unspecified type - Plan: CBC with Differential/Platelet, TSH, levothyroxine (SYNTHROID) 100 MCG  tablet  Screen for colon cancer - Plan: Ambulatory referral to Gastroenterology   Meds ordered this encounter  Medications   levothyroxine (SYNTHROID) 100 MCG tablet    Sig: Take 1 tablet (100 mcg total) by mouth daily.    Dispense:  90 tablet    Refill:  1   metFORMIN (GLUCOPHAGE) 1000 MG tablet    Sig: Take 1 tablet (1,000 mg total) by mouth 2 (two) times daily with a meal.    Dispense:  180 tablet    Refill:  3   pravastatin (PRAVACHOL) 20 MG tablet    Sig: Take 1 tablet (20 mg total) by mouth daily.    Dispense:  30 tablet    Refill:  1   sitaGLIPtin (JANUVIA) 100 MG tablet    Sig: Take 1 tablet (100 mg total) by mouth daily.    Dispense:  90 tablet    Refill:  1   Patient Instructions  Thank you for coming in today.  Depending on A1c reading we certainly could consider Jardiance to add to the metformin and the Januvia.  I will let you know once I review the labs.  No other med changes at this time.  Hopefully the better weather will make exercise easier, but fitness facility with pool may be easier on the knees.  Let me know if you have any questions.  Take Care!  Preventive Care  40-61 Years Old, Female Preventive care refers to lifestyle choices and visits with your health care provider that can promote health and wellness. Preventive care visits are also called wellness exams. What can I expect for my preventive care visit? Counseling Your health care provider may ask you questions about your: Medical history, including: Past medical problems. Family medical history. Pregnancy history. Current health, including: Menstrual cycle. Method of birth control. Emotional well-being. Home life and relationship well-being. Sexual activity and sexual health. Lifestyle, including: Alcohol, nicotine or tobacco, and drug use. Access to firearms. Diet, exercise, and sleep habits. Work and work Astronomer. Sunscreen use. Safety issues such as seatbelt and bike helmet  use. Physical exam Your health care provider will check your: Height and weight. These may be used to calculate your BMI (body mass index). BMI is a measurement that tells if you are at a healthy weight. Waist circumference. This measures the distance around your waistline. This measurement also tells if you are at a healthy weight and may help predict your risk of certain diseases, such as type 2 diabetes and high blood pressure. Heart rate and blood pressure. Body temperature. Skin for abnormal spots. What immunizations do I need?  Vaccines are usually given at various ages, according to a schedule. Your health care provider will recommend vaccines for you based on your age, medical history, and lifestyle or other factors, such as travel or where you work. What tests do I need? Screening Your health care provider may recommend screening tests for certain conditions. This may include: Lipid and cholesterol levels. Diabetes screening. This is done by checking your blood sugar (glucose) after you have not eaten for a while (fasting). Pelvic exam and Pap test. Hepatitis B test. Hepatitis C test. HIV (human immunodeficiency virus) test. STI (sexually transmitted infection) testing, if you are at risk. Lung cancer screening. Colorectal cancer screening. Mammogram. Talk with your health care provider about when you should start having regular mammograms. This may depend on whether you have a family history of breast cancer. BRCA-related cancer screening. This may be done if you have a family history of breast, ovarian, tubal, or peritoneal cancers. Bone density scan. This is done to screen for osteoporosis. Talk with your health care provider about your test results, treatment options, and if necessary, the need for more tests. Follow these instructions at home: Eating and drinking  Eat a diet that includes fresh fruits and vegetables, whole grains, lean protein, and low-fat dairy  products. Take vitamin and mineral supplements as recommended by your health care provider. Do not drink alcohol if: Your health care provider tells you not to drink. You are pregnant, may be pregnant, or are planning to become pregnant. If you drink alcohol: Limit how much you have to 0-1 drink a day. Know how much alcohol is in your drink. In the [place] one drink equals one 12 oz bottle of beer (355 mL), one 5 oz glass of wine (148 mL), or one 1 oz glass of hard liquor (44 mL). Lifestyle Brush your teeth every morning and night with fluoride toothpaste. Floss one time each day. Exercise for at least 30 minutes 5 or more days each week. Do not use any products that contain nicotine or tobacco. These products include cigarettes, chewing tobacco, and vaping devices, such as e-cigarettes. If you need help quitting, ask your health care provider. Do not use drugs. If you are sexually active, practice safe sex. Use a condom or other form of protection to prevent  STIs. If you do not wish to become pregnant, use a form of birth control. If you plan to become pregnant, see your health care provider for a prepregnancy visit. Take aspirin only as told by your health care provider. Make sure that you understand how much to take and what form to take. Work with your health care provider to find out whether it is safe and beneficial for you to take aspirin daily. Find healthy ways to manage stress, such as: Meditation, yoga, or listening to music. Journaling. Talking to a trusted person. Spending time with friends and family. Minimize exposure to UV radiation to reduce your risk of skin cancer. Safety Always wear your seat belt while driving or riding in a vehicle. Do not drive: If you have been drinking alcohol. Do not ride with someone who has been drinking. When you are tired or distracted. While texting. If you have been using any mind-altering substances or drugs. Wear a helmet and other  protective equipment during sports activities. If you have firearms in your house, make sure you follow all gun safety procedures. Seek help if you have been physically or sexually abused. What's next? Visit your health care provider once a year for an annual wellness visit. Ask your health care provider how often you should have your eyes and teeth checked. Stay up to date on all vaccines. This information is not intended to replace advice given to you by your health care provider. Make sure you discuss any questions you have with your health care provider. Document Revised: 10/31/2020 Document Reviewed: 10/31/2020 Elsevier Patient Education  2024 Elsevier Inc.    Signed,   Meredith Staggers, MD [facility], [facility] 07/27/23 2:59 PM

## 2023-07-27 NOTE — Patient Instructions (Signed)
 Thank you for coming in today.  Depending on A1c reading we certainly could consider Jardiance to add to the metformin and the Januvia.  I will let you know once I review the labs.  No other med changes at this time.  Hopefully the better weather will make exercise easier, but fitness facility with pool may be easier on the knees.  Let me know if you have any questions.  Take Care!  Preventive Care 19-62 Years Old, Female Preventive care refers to lifestyle choices and visits with your health care provider that can promote health and wellness. Preventive care visits are also called wellness exams. What can I expect for my preventive care visit? Counseling Your health care provider may ask you questions about your: Medical history, including: Past medical problems. Family medical history. Pregnancy history. Current health, including: Menstrual cycle. Method of birth control. Emotional well-being. Home life and relationship well-being. Sexual activity and sexual health. Lifestyle, including: Alcohol, nicotine or tobacco, and drug use. Access to firearms. Diet, exercise, and sleep habits. Work and work Astronomer. Sunscreen use. Safety issues such as seatbelt and bike helmet use. Physical exam Your health care provider will check your: Height and weight. These may be used to calculate your BMI (body mass index). BMI is a measurement that tells if you are at a healthy weight. Waist circumference. This measures the distance around your waistline. This measurement also tells if you are at a healthy weight and may help predict your risk of certain diseases, such as type 2 diabetes and high blood pressure. Heart rate and blood pressure. Body temperature. Skin for abnormal spots. What immunizations do I need?  Vaccines are usually given at various ages, according to a schedule. Your health care provider will recommend vaccines for you based on your age, medical history, and lifestyle or  other factors, such as travel or where you work. What tests do I need? Screening Your health care provider may recommend screening tests for certain conditions. This may include: Lipid and cholesterol levels. Diabetes screening. This is done by checking your blood sugar (glucose) after you have not eaten for a while (fasting). Pelvic exam and Pap test. Hepatitis B test. Hepatitis C test. HIV (human immunodeficiency virus) test. STI (sexually transmitted infection) testing, if you are at risk. Lung cancer screening. Colorectal cancer screening. Mammogram. Talk with your health care provider about when you should start having regular mammograms. This may depend on whether you have a family history of breast cancer. BRCA-related cancer screening. This may be done if you have a family history of breast, ovarian, tubal, or peritoneal cancers. Bone density scan. This is done to screen for osteoporosis. Talk with your health care provider about your test results, treatment options, and if necessary, the need for more tests. Follow these instructions at home: Eating and drinking  Eat a diet that includes fresh fruits and vegetables, whole grains, lean protein, and low-fat dairy products. Take vitamin and mineral supplements as recommended by your health care provider. Do not drink alcohol if: Your health care provider tells you not to drink. You are pregnant, may be pregnant, or are planning to become pregnant. If you drink alcohol: Limit how much you have to 0-1 drink a day. Know how much alcohol is in your drink. In the [place] one drink equals one 12 oz bottle of beer (355 mL), one 5 oz glass of wine (148 mL), or one 1 oz glass of hard liquor (44 mL). Lifestyle Brush your teeth every  morning and night with fluoride toothpaste. Floss one time each day. Exercise for at least 30 minutes 5 or more days each week. Do not use any products that contain nicotine or tobacco. These products include  cigarettes, chewing tobacco, and vaping devices, such as e-cigarettes. If you need help quitting, ask your health care provider. Do not use drugs. If you are sexually active, practice safe sex. Use a condom or other form of protection to prevent STIs. If you do not wish to become pregnant, use a form of birth control. If you plan to become pregnant, see your health care provider for a prepregnancy visit. Take aspirin only as told by your health care provider. Make sure that you understand how much to take and what form to take. Work with your health care provider to find out whether it is safe and beneficial for you to take aspirin daily. Find healthy ways to manage stress, such as: Meditation, yoga, or listening to music. Journaling. Talking to a trusted person. Spending time with friends and family. Minimize exposure to UV radiation to reduce your risk of skin cancer. Safety Always wear your seat belt while driving or riding in a vehicle. Do not drive: If you have been drinking alcohol. Do not ride with someone who has been drinking. When you are tired or distracted. While texting. If you have been using any mind-altering substances or drugs. Wear a helmet and other protective equipment during sports activities. If you have firearms in your house, make sure you follow all gun safety procedures. Seek help if you have been physically or sexually abused. What's next? Visit your health care provider once a year for an annual wellness visit. Ask your health care provider how often you should have your eyes and teeth checked. Stay up to date on all vaccines. This information is not intended to replace advice given to you by your health care provider. Make sure you discuss any questions you have with your health care provider. Document Revised: 10/31/2020 Document Reviewed: 10/31/2020 Elsevier Patient Education  2024 [employer].

## 2023-07-28 ENCOUNTER — Encounter (INDEPENDENT_AMBULATORY_CARE_PROVIDER_SITE_OTHER): Payer: Self-pay | Admitting: *Deleted

## 2023-07-28 ENCOUNTER — Encounter: Payer: Self-pay | Admitting: Pharmacist

## 2023-07-28 ENCOUNTER — Other Ambulatory Visit (HOSPITAL_COMMUNITY): Payer: Self-pay

## 2023-07-28 ENCOUNTER — Other Ambulatory Visit: Payer: Self-pay

## 2023-07-28 ENCOUNTER — Encounter: Payer: Self-pay | Admitting: Family Medicine

## 2023-07-28 ENCOUNTER — Other Ambulatory Visit: Payer: Self-pay | Admitting: Family Medicine

## 2023-07-28 DIAGNOSIS — E1165 Type 2 diabetes mellitus with hyperglycemia: Secondary | ICD-10-CM

## 2023-07-28 LAB — CBC WITH DIFFERENTIAL/PLATELET
Basophils Absolute: 0 10*3/uL (ref 0.0–0.1)
Basophils Relative: 0.7 % (ref 0.0–3.0)
Eosinophils Absolute: 0.1 10*3/uL (ref 0.0–0.7)
Eosinophils Relative: 1 % (ref 0.0–5.0)
HCT: 42.2 % (ref 36.0–46.0)
Hemoglobin: 14 g/dL (ref 12.0–15.0)
Lymphocytes Relative: 33.6 % (ref 12.0–46.0)
Lymphs Abs: 2 10*3/uL (ref 0.7–4.0)
MCHC: 33.2 g/dL (ref 30.0–36.0)
MCV: 97.1 fl (ref 78.0–100.0)
Monocytes Absolute: 0.3 10*3/uL (ref 0.1–1.0)
Monocytes Relative: 5.5 % (ref 3.0–12.0)
Neutro Abs: 3.5 10*3/uL (ref 1.4–7.7)
Neutrophils Relative %: 59.2 % (ref 43.0–77.0)
Platelets: 236 10*3/uL (ref 150.0–400.0)
RBC: 4.34 Mil/uL (ref 3.87–5.11)
RDW: 14.2 % (ref 11.5–15.5)
WBC: 5.9 10*3/uL (ref 4.0–10.5)

## 2023-07-28 LAB — COMPREHENSIVE METABOLIC PANEL
ALT: 9 U/L (ref 0–35)
AST: 13 U/L (ref 0–37)
Albumin: 4.8 g/dL (ref 3.5–5.2)
Alkaline Phosphatase: 60 U/L (ref 39–117)
BUN: 16 mg/dL (ref 6–23)
CO2: 27 meq/L (ref 19–32)
Calcium: 9.9 mg/dL (ref 8.4–10.5)
Chloride: 101 meq/L (ref 96–112)
Creatinine, Ser: 0.92 mg/dL (ref 0.40–1.20)
GFR: 67.05 mL/min (ref >60.00)
Glucose, Bld: 127 mg/dL — ABNORMAL HIGH (ref 70–99)
Potassium: 4.5 meq/L (ref 3.5–5.1)
Sodium: 138 meq/L (ref 135–145)
Total Bilirubin: 1 mg/dL (ref 0.2–1.2)
Total Protein: 7.9 g/dL (ref 6.0–8.3)

## 2023-07-28 LAB — LIPID PANEL
Cholesterol: 251 mg/dL — ABNORMAL HIGH (ref 0–200)
HDL: 100.3 mg/dL (ref >39.00)
LDL Cholesterol: 141 mg/dL — ABNORMAL HIGH (ref 0–99)
NonHDL: 150.24
Total CHOL/HDL Ratio: 2
Triglycerides: 47 mg/dL (ref 0.0–149.0)
VLDL: 9.4 mg/dL (ref 0.0–40.0)

## 2023-07-28 LAB — TSH: TSH: 3.58 u[IU]/mL (ref 0.35–5.50)

## 2023-07-28 LAB — HEMOGLOBIN A1C: Hgb A1c MFr Bld: 8.4 % — ABNORMAL HIGH (ref 4.6–6.5)

## 2023-07-28 MED ORDER — EMPAGLIFLOZIN 10 MG PO TABS
10.0000 mg | ORAL_TABLET | Freq: Every day | ORAL | 2 refills | Status: DC
  Filled 2023-07-28: qty 30, 30d supply, fill #0
  Filled 2023-08-23: qty 30, 30d supply, fill #1
  Filled 2023-09-22: qty 30, 30d supply, fill #2

## 2023-07-28 NOTE — Progress Notes (Signed)
 See lab notes

## 2023-07-29 ENCOUNTER — Other Ambulatory Visit: Payer: Self-pay

## 2023-08-21 ENCOUNTER — Other Ambulatory Visit (HOSPITAL_COMMUNITY): Payer: Self-pay

## 2023-08-23 ENCOUNTER — Other Ambulatory Visit (HOSPITAL_COMMUNITY): Payer: Self-pay

## 2023-09-21 ENCOUNTER — Other Ambulatory Visit: Payer: Self-pay | Admitting: Family Medicine

## 2023-09-21 ENCOUNTER — Other Ambulatory Visit: Payer: Self-pay

## 2023-09-21 DIAGNOSIS — E1169 Type 2 diabetes mellitus with other specified complication: Secondary | ICD-10-CM

## 2023-09-21 MED ORDER — PRAVASTATIN SODIUM 20 MG PO TABS
20.0000 mg | ORAL_TABLET | Freq: Every day | ORAL | 1 refills | Status: DC
  Filled 2023-09-21: qty 30, 30d supply, fill #0
  Filled 2023-10-24: qty 30, 30d supply, fill #1

## 2023-09-22 ENCOUNTER — Other Ambulatory Visit (HOSPITAL_COMMUNITY): Payer: Self-pay

## 2023-10-13 ENCOUNTER — Other Ambulatory Visit: Payer: Self-pay

## 2023-10-24 ENCOUNTER — Other Ambulatory Visit: Payer: Self-pay | Admitting: Family Medicine

## 2023-10-24 ENCOUNTER — Other Ambulatory Visit (HOSPITAL_COMMUNITY): Payer: Self-pay

## 2023-10-24 DIAGNOSIS — E1165 Type 2 diabetes mellitus with hyperglycemia: Secondary | ICD-10-CM

## 2023-10-26 ENCOUNTER — Other Ambulatory Visit: Payer: Self-pay

## 2023-10-26 ENCOUNTER — Other Ambulatory Visit (HOSPITAL_COMMUNITY): Payer: Self-pay

## 2023-10-26 MED ORDER — EMPAGLIFLOZIN 10 MG PO TABS
10.0000 mg | ORAL_TABLET | Freq: Every day | ORAL | 2 refills | Status: DC
  Filled 2023-10-26: qty 30, 30d supply, fill #0

## 2023-11-02 ENCOUNTER — Other Ambulatory Visit (HOSPITAL_COMMUNITY): Payer: Self-pay

## 2023-11-11 ENCOUNTER — Other Ambulatory Visit (HOSPITAL_COMMUNITY): Payer: Self-pay

## 2023-11-11 ENCOUNTER — Other Ambulatory Visit: Payer: Self-pay

## 2023-11-11 ENCOUNTER — Ambulatory Visit (INDEPENDENT_AMBULATORY_CARE_PROVIDER_SITE_OTHER): Payer: Self-pay | Admitting: Family Medicine

## 2023-11-11 VITALS — BP 114/62 | HR 70 | Temp 98.0°F | Resp 13 | Ht 66.75 in | Wt 171.4 lb

## 2023-11-11 DIAGNOSIS — E1169 Type 2 diabetes mellitus with other specified complication: Secondary | ICD-10-CM

## 2023-11-11 DIAGNOSIS — E1165 Type 2 diabetes mellitus with hyperglycemia: Secondary | ICD-10-CM

## 2023-11-11 DIAGNOSIS — Z23 Encounter for immunization: Secondary | ICD-10-CM

## 2023-11-11 DIAGNOSIS — C44519 Basal cell carcinoma of skin of other part of trunk: Secondary | ICD-10-CM | POA: Diagnosis not present

## 2023-11-11 DIAGNOSIS — Z7985 Long-term (current) use of injectable non-insulin antidiabetic drugs: Secondary | ICD-10-CM | POA: Diagnosis not present

## 2023-11-11 DIAGNOSIS — D2372 Other benign neoplasm of skin of left lower limb, including hip: Secondary | ICD-10-CM | POA: Diagnosis not present

## 2023-11-11 DIAGNOSIS — D1801 Hemangioma of skin and subcutaneous tissue: Secondary | ICD-10-CM | POA: Diagnosis not present

## 2023-11-11 DIAGNOSIS — E785 Hyperlipidemia, unspecified: Secondary | ICD-10-CM | POA: Diagnosis not present

## 2023-11-11 DIAGNOSIS — Z85828 Personal history of other malignant neoplasm of skin: Secondary | ICD-10-CM | POA: Diagnosis not present

## 2023-11-11 DIAGNOSIS — L821 Other seborrheic keratosis: Secondary | ICD-10-CM | POA: Diagnosis not present

## 2023-11-11 DIAGNOSIS — L814 Other melanin hyperpigmentation: Secondary | ICD-10-CM | POA: Diagnosis not present

## 2023-11-11 MED ORDER — EMPAGLIFLOZIN 10 MG PO TABS
10.0000 mg | ORAL_TABLET | Freq: Every day | ORAL | 2 refills | Status: DC
  Filled 2023-11-11: qty 30, 30d supply, fill #0

## 2023-11-11 MED ORDER — EZETIMIBE 10 MG PO TABS
10.0000 mg | ORAL_TABLET | Freq: Every day | ORAL | 1 refills | Status: DC
  Filled 2023-11-11: qty 90, 90d supply, fill #0

## 2023-11-11 MED ORDER — SITAGLIPTIN PHOSPHATE 100 MG PO TABS
100.0000 mg | ORAL_TABLET | Freq: Every day | ORAL | 1 refills | Status: DC
  Filled 2023-11-11 – 2024-01-19 (×2): qty 90, 90d supply, fill #0

## 2023-11-11 NOTE — Progress Notes (Signed)
 Subjective:  Patient ID: Jenna Carroll, female    DOB: 1960/10/05  Age: 63 y.o. MRN: [id number]  CC:  Chief Complaint  Patient presents with   Medical Management of Chronic Issues    Pt is doing okay, notes she stopped pravastatin  5 weeks ago due to muscle aches     HPI Jenna Carroll presents for   Diabetes: Complicated by hyperglycemia.  Metformin  1000 mg twice daily, Januvia  100 mg daily as of her March visit.  Unfortunately A1c elevated at that time.  Added Jardiance  10 mg daily. Microalbumin: Normal ratio 11/12/2022. Fasting 110-120.  No mycotic or uti sx's.  Optho, foot exam, pneumovax:  Prevnar 20 today.  Declines HIV screening.  Colonoscopy planned - had to be rescheduled.  Foot exam with podiatry - in October.    Immunization History  Administered Date(s) Administered   Influenza,inj,Quad PF,6+ Mos 02/16/2019, 02/05/2022   Influenza-Unspecified 02/21/2020, 03/07/2021, 02/09/2023   Moderna SARS-COV2 Booster Vaccination 03/26/2020   Moderna Sars-Covid-2 Vaccination 05/17/2019, 06/13/2019, 03/26/2020, 09/28/2020   Pneumococcal Polysaccharide-23 09/23/2017   Tdap 05/17/2012, 02/18/2023   Zoster Recombinant(Shingrix ) 10/30/2021, 02/05/2022    Lab Results  Component Value Date   HGBA1C 8.4 (H) 07/27/2023   HGBA1C 8.0 (H) 02/18/2023   HGBA1C 7.7 (H) 11/12/2022   Lab Results  Component Value Date   MICROALBUR <0.7 11/12/2022   LDLCALC 141 (H) 07/27/2023   CREATININE 0.92 07/27/2023   Hyperlipidemia: Previous myalgias/arthralgias with Lipitor, had changed to pravastatin , 2 days/week as of her March visit with minimal myalgias.  Continued same with co-Q10 supplement.  Tried 3 days then 5 days with elevated number - worse aches.  However due to myalgias/arthralgias she has discontinued pravastatin  altogether 5 weeks ago. Feels much better off pravastain. Also felt brain fog on that med.  Better. Exercising more. 10# weight loss.  Options discussed, including PCSK9.  Would like to try zetia for now.   Lab Results  Component Value Date   CHOL 251 (H) 07/27/2023   HDL 100.30 07/27/2023   LDLCALC 141 (H) 07/27/2023   TRIG 47.0 07/27/2023   CHOLHDL 2 07/27/2023   Lab Results  Component Value Date   ALT 9 07/27/2023   AST 13 07/27/2023   ALKPHOS 60 07/27/2023   BILITOT 1.0 07/27/2023   Wt Readings from Last 3 Encounters:  11/11/23 171 lb 6.4 oz (77.7 kg)  07/27/23 181 lb 12.8 oz (82.5 kg)  04/29/23 182 lb (82.6 kg)       History Patient Active Problem List   Diagnosis Date Noted   Encounter for routine gynecological examination with Papanicolaou smear of cervix 04/29/2023   Hyperlipidemia associated with type 2 diabetes mellitus (HCC) 04/24/2021   Hormone replacement therapy 04/04/2021   Postmenopause 04/04/2021   Encounter for screening fecal occult blood testing 04/04/2021   Type 2 diabetes mellitus with hyperglycemia, without long-term current use of insulin (HCC) 04/04/2021   Encounter for colorectal cancer screening 10/25/2019   Lichen sclerosus et atrophicus 10/25/2019   Screening for colorectal cancer 02/24/2018   Encounter for well woman exam with routine gynecological exam 02/24/2018   Encounter for gynecological examination with Papanicolaou smear of cervix 04/08/2016   IUD (intrauterine device) in place 04/08/2016   Diabetes mellitus type 2, diet-controlled (HCC) 09/29/2012   Microalbuminuria 09/29/2012   UTI (urinary tract infection) 09/29/2012   Skin mole 09/29/2012   Hypothyroidism 03/16/2012   Arthritis 03/16/2012   Vitamin D  deficiency 03/16/2012   Past Medical History:  Diagnosis Date  Arthritis    DM (diabetes mellitus) (HCC) 02/17/2012   Hyperlipidemia, mild    Hypothyroidism    Thyroid  disease    Past Surgical History:  Procedure Laterality Date   left ankle     TONSILLECTOMY AND ADENOIDECTOMY     WISDOM TOOTH EXTRACTION     Allergies  Allergen Reactions   Lipitor [Atorvastatin ] Other (See Comments)     Arthralgia/myalgia.    Prior to Admission medications   Medication Sig Start Date End Date Taking? Authorizing Provider  Cholecalciferol (VITAMIN D -3) 125 MCG (5000 UT) TABS Take 1 tablet by mouth daily.   Yes [provider]  clobetasol  ointment (TEMOVATE ) 0.05 % Apply 1 topically 2 (two) times daily. 04/29/23  Yes Signa Nest A, NP  Coenzyme Q10 (COQ10 PO) Take 300 mg by mouth daily.   Yes [provider]  Cyanocobalamin (VITAMIN B-12 PO) Take 5,000 mcg by mouth daily.   Yes [provider]  empagliflozin  (JARDIANCE ) 10 MG TABS tablet Take 1 tablet (10 mg total) by mouth daily. 10/26/23  Yes Levora Reyes SAUNDERS, MD  estradiol  (ESTRACE ) 1 MG tablet Take 1 tablet (1 mg total) by mouth daily. 04/21/23 04/20/24 Yes Jayne Vonn DEL, MD  levothyroxine  (SYNTHROID ) 100 MCG tablet Take 1 tablet (100 mcg total) by mouth daily. 07/27/23  Yes Levora Reyes SAUNDERS, MD  MAGNESIUM GLUCONATE PO Take 300 mg by mouth daily.   Yes [provider]  metFORMIN  (GLUCOPHAGE ) 1000 MG tablet Take 1 tablet (1,000 mg total) by mouth 2 (two) times daily with a meal. 07/27/23  Yes Levora Reyes SAUNDERS, MD  Naproxen Sodium (ALEVE PO) Take by mouth as needed.   Yes [provider]  progesterone  (PROMETRIUM ) 200 MG capsule Take 1 capsule (200 mg total) by mouth daily. 04/21/23  Yes Jayne Vonn DEL, MD  sitaGLIPtin  (JANUVIA ) 100 MG tablet Take 1 tablet (100 mg total) by mouth daily. 07/27/23  Yes Levora Reyes SAUNDERS, MD  sodium fluoride  (SODIUM FLUORIDE  5000 PPM) 1.1 % GEL dental gel Apply 1 time per day at night; Apply pea-sized amount to toothbrush. Brush a full three mintues. Spit out excess. Do not rinse following use 07/16/22  Yes Myra Pac, DMD  pravastatin  (PRAVACHOL ) 20 MG tablet Take 1 tablet (20 mg total) by mouth daily. Patient not taking: Reported on 11/11/2023 09/21/23   Levora Reyes SAUNDERS, MD   Social History   Socioeconomic History   Marital status: Married    Spouse name:  Not on file   Number of children: 0   Years of education: Not on file   Highest education level: Bachelor's degree (e.g., BA, AB, BS)  Occupational History   Not on file  Tobacco Use   Smoking status: Never   Smokeless tobacco: Never  Vaping Use   Vaping status: Never Used  Substance and Sexual Activity   Alcohol use: Yes    Comment: occ   Drug use: No   Sexual activity: Yes    Birth control/protection: Post-menopausal  Other Topics Concern   Not on file  Social History Narrative   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   Social Drivers of [employer]: Low Risk  (11/11/2023)   Overall Financial Resource Strain (CARDIA)    Difficulty of Paying Living Expenses: Not very hard  Food Insecurity: No Food Insecurity (11/11/2023)   Hunger Vital Sign    Worried About Running Out of Food in the Last Year: Never true  Ran Out of Food in the Last Year: Never true  Transportation Needs: No Transportation Needs (11/11/2023)   PRAPARE - Administrator, Civil Service (Medical): No    Lack of Transportation (Non-Medical): No  Physical Activity: Sufficiently Active (11/11/2023)   Exercise Vital Sign    Days of Exercise per Week: 5 days    Minutes of Exercise per Session: 40 min  Stress: No Stress Concern Present (11/11/2023)   [employer] of Occupational Health - Occupational Stress Questionnaire    Feeling of Stress: Only a little  Social Connections: Unknown (11/11/2023)   Social Connection and Isolation Panel    Frequency of Communication with Friends and Family: Once a week    Frequency of Social Gatherings with Friends and Family: Patient declined    Attends Religious Services: Patient declined    Database administrator or Organizations: Yes    Attends Banker Meetings: 1 to 4 times per year    Marital Status: Married  Catering manager Violence: Unknown (04/29/2023)   Humiliation, Afraid, Rape, and Kick  questionnaire    Fear of Current or Ex-Partner: No    Emotionally Abused: No    Physically Abused: No    Sexually Abused: Not on file    Review of Systems  Constitutional:  Negative for fatigue and unexpected weight change.  Respiratory:  Negative for chest tightness and shortness of breath.   Cardiovascular:  Negative for chest pain, palpitations and leg swelling.  Gastrointestinal:  Negative for abdominal pain and blood in stool.  Neurological:  Negative for dizziness, syncope, light-headedness and headaches.     Objective:   Vitals:   11/11/23 1341  BP: 114/62  Pulse: 70  Resp: 13  Temp: 98 F (36.7 C)  TempSrc: Temporal  SpO2: 97%  Weight: 171 lb 6.4 oz (77.7 kg)  Height: 5' 6.75 (1.695 m)     Physical Exam Vitals reviewed.  Constitutional:      Appearance: Normal appearance. She is well-developed.  HENT:     Head: Normocephalic and atraumatic.   Eyes:     Conjunctiva/sclera: Conjunctivae normal.     Pupils: Pupils are equal, round, and reactive to light.   Neck:     Vascular: No carotid bruit.   Cardiovascular:     Rate and Rhythm: Normal rate and regular rhythm.     Heart sounds: Normal heart sounds.  Pulmonary:     Effort: Pulmonary effort is normal.     Breath sounds: Normal breath sounds.  Abdominal:     Palpations: Abdomen is soft. There is no pulsatile mass.     Tenderness: There is no abdominal tenderness.   Musculoskeletal:     Right lower leg: No edema.     Left lower leg: No edema.   Skin:    General: Skin is warm and dry.   Neurological:     Mental Status: She is alert and oriented to person, place, and time.   Psychiatric:        Mood and Affect: Mood normal.        Behavior: Behavior normal.        Assessment & Plan:  Jenna Carroll is a 63 y.o. female . Type 2 diabetes mellitus with hyperglycemia, without long-term current use of insulin (HCC) - Plan: Comprehensive metabolic panel with GFR, Lipid panel, Hemoglobin A1c,  empagliflozin  (JARDIANCE ) 10 MG TABS tablet, sitaGLIPtin  (JANUVIA ) 100 MG tablet  -  Stable, tolerating current regimen. Medications refilled. Labs  pending as above.   Need for pneumococcal vaccination - Plan: Pneumococcal conjugate vaccine 20-valent (Prevnar 20)  Hyperlipidemia associated with type 2 diabetes mellitus (HCC) - Plan: ezetimibe (ZETIA) 10 MG tablet, Lipid panel, Comprehensive metabolic panel with GFR, Lipid panel  - Unfortunately intolerant to statins.  Will try Zetia but would consider PCSK9 inhibitor depending on results with diet/exercise and Zetia approach.  6 weeks lab visit, 103-month follow-up with me.  Will check baseline labs today as off statin for 5 weeks.  Meds ordered this encounter  Medications   ezetimibe (ZETIA) 10 MG tablet    Sig: Take 1 tablet (10 mg total) by mouth daily.    Dispense:  90 tablet    Refill:  1   empagliflozin  (JARDIANCE ) 10 MG TABS tablet    Sig: Take 1 tablet (10 mg total) by mouth daily.    Dispense:  30 tablet    Refill:  2   sitaGLIPtin  (JANUVIA ) 100 MG tablet    Sig: Take 1 tablet (100 mg total) by mouth daily.    Dispense:  90 tablet    Refill:  1   Patient Instructions  Thanks for coming in today.  We can try Zetia in place of the statin, keep up the good work with diet, exercise as weight has improved today.  That should help with both blood sugar and cholesterol.  Glad to hear you are tolerating the new blood sugar medication, no changes for now.  If any concerns on labs I will let you know.  6-week lab visit, 23-month appointment with me.  Take care!    Signed,   Reyes Pines, MD [facility], [facility] 11/11/23 2:16 PM

## 2023-11-11 NOTE — Patient Instructions (Signed)
 Thanks for coming in today.  We can try Zetia in place of the statin, keep up the good work with diet, exercise as weight has improved today.  That should help with both blood sugar and cholesterol.  Glad to hear you are tolerating the new blood sugar medication, no changes for now.  If any concerns on labs I will let you know.  6-week lab visit, 38-month appointment with me.  Take care!

## 2023-11-12 LAB — COMPREHENSIVE METABOLIC PANEL WITH GFR
ALT: 10 U/L (ref 0–35)
AST: 15 U/L (ref 0–37)
Albumin: 4.5 g/dL (ref 3.5–5.2)
Alkaline Phosphatase: 56 U/L (ref 39–117)
BUN: 24 mg/dL — ABNORMAL HIGH (ref 6–23)
CO2: 28 meq/L (ref 19–32)
Calcium: 9.4 mg/dL (ref 8.4–10.5)
Chloride: 102 meq/L (ref 96–112)
Creatinine, Ser: 0.92 mg/dL (ref 0.40–1.20)
GFR: 66.91 mL/min (ref >60.00)
Glucose, Bld: 93 mg/dL (ref 70–99)
Potassium: 4.4 meq/L (ref 3.5–5.1)
Sodium: 138 meq/L (ref 135–145)
Total Bilirubin: 1.1 mg/dL (ref 0.2–1.2)
Total Protein: 7.5 g/dL (ref 6.0–8.3)

## 2023-11-12 LAB — LIPID PANEL
Cholesterol: 259 mg/dL — ABNORMAL HIGH (ref 0–200)
HDL: 98 mg/dL (ref >39.00)
LDL Cholesterol: 153 mg/dL — ABNORMAL HIGH (ref 0–99)
NonHDL: 161.1
Total CHOL/HDL Ratio: 3
Triglycerides: 43 mg/dL (ref 0.0–149.0)
VLDL: 8.6 mg/dL (ref 0.0–40.0)

## 2023-11-12 LAB — MICROALBUMIN / CREATININE URINE RATIO
Creatinine,U: 187.7 mg/dL
Microalb Creat Ratio: 5.9 mg/g (ref 0.0–30.0)
Microalb, Ur: 1.1 mg/dL (ref 0.0–1.9)

## 2023-11-12 LAB — HEMOGLOBIN A1C: Hgb A1c MFr Bld: 8.2 % — ABNORMAL HIGH (ref 4.6–6.5)

## 2023-11-14 ENCOUNTER — Other Ambulatory Visit: Payer: Self-pay | Admitting: Family Medicine

## 2023-11-14 ENCOUNTER — Ambulatory Visit: Payer: Self-pay | Admitting: Family Medicine

## 2023-11-14 DIAGNOSIS — E1165 Type 2 diabetes mellitus with hyperglycemia: Secondary | ICD-10-CM

## 2023-11-14 MED ORDER — EMPAGLIFLOZIN 25 MG PO TABS
25.0000 mg | ORAL_TABLET | Freq: Every day | ORAL | 1 refills | Status: DC
  Filled 2023-11-14 – 2023-12-04 (×2): qty 90, 90d supply, fill #0

## 2023-11-14 NOTE — Progress Notes (Signed)
 See lab notes

## 2023-11-16 ENCOUNTER — Other Ambulatory Visit: Payer: Self-pay

## 2023-11-16 ENCOUNTER — Other Ambulatory Visit (HOSPITAL_COMMUNITY): Payer: Self-pay

## 2023-11-17 ENCOUNTER — Other Ambulatory Visit: Payer: Self-pay

## 2023-11-26 ENCOUNTER — Other Ambulatory Visit (HOSPITAL_COMMUNITY): Payer: Self-pay

## 2023-12-04 ENCOUNTER — Other Ambulatory Visit: Payer: Self-pay

## 2023-12-04 ENCOUNTER — Other Ambulatory Visit (HOSPITAL_COMMUNITY): Payer: Self-pay

## 2023-12-11 ENCOUNTER — Encounter: Payer: Self-pay | Admitting: Family Medicine

## 2023-12-11 ENCOUNTER — Telehealth: Payer: Self-pay | Admitting: Family Medicine

## 2023-12-11 DIAGNOSIS — C4491 Basal cell carcinoma of skin, unspecified: Secondary | ICD-10-CM | POA: Insufficient documentation

## 2023-12-11 NOTE — Telephone Encounter (Signed)
 Patient canceled lab recheck due to not taking medication and wishes to not recheck labs

## 2023-12-11 NOTE — Telephone Encounter (Signed)
 Pt canceled lab appointment that was scheduled for 8/5   Cancel Reason: not taking medication and do not wish to recheck labs

## 2023-12-22 ENCOUNTER — Other Ambulatory Visit

## 2024-01-19 ENCOUNTER — Other Ambulatory Visit: Payer: Self-pay

## 2024-01-19 ENCOUNTER — Other Ambulatory Visit (HOSPITAL_COMMUNITY): Payer: Self-pay

## 2024-01-26 ENCOUNTER — Other Ambulatory Visit (HOSPITAL_COMMUNITY): Payer: Self-pay

## 2024-01-26 ENCOUNTER — Other Ambulatory Visit: Payer: Self-pay | Admitting: Family Medicine

## 2024-01-26 DIAGNOSIS — E039 Hypothyroidism, unspecified: Secondary | ICD-10-CM

## 2024-01-26 MED ORDER — LEVOTHYROXINE SODIUM 100 MCG PO TABS
100.0000 ug | ORAL_TABLET | Freq: Every day | ORAL | 1 refills | Status: DC
Start: 2024-01-26 — End: 2024-02-18
  Filled 2024-01-26: qty 90, 90d supply, fill #0

## 2024-01-28 ENCOUNTER — Encounter (INDEPENDENT_AMBULATORY_CARE_PROVIDER_SITE_OTHER): Payer: Self-pay | Admitting: *Deleted

## 2024-02-04 ENCOUNTER — Other Ambulatory Visit (HOSPITAL_COMMUNITY): Payer: Self-pay | Admitting: Family Medicine

## 2024-02-04 DIAGNOSIS — Z1231 Encounter for screening mammogram for malignant neoplasm of breast: Secondary | ICD-10-CM

## 2024-02-11 ENCOUNTER — Ambulatory Visit: Admitting: Family Medicine

## 2024-02-18 ENCOUNTER — Other Ambulatory Visit (HOSPITAL_COMMUNITY): Payer: Self-pay

## 2024-02-18 ENCOUNTER — Other Ambulatory Visit: Payer: Self-pay

## 2024-02-18 ENCOUNTER — Encounter: Payer: Self-pay | Admitting: Family Medicine

## 2024-02-18 ENCOUNTER — Ambulatory Visit: Admitting: Family Medicine

## 2024-02-18 VITALS — BP 124/72 | HR 79 | Temp 98.4°F | Ht 66.75 in | Wt 170.2 lb

## 2024-02-18 DIAGNOSIS — E039 Hypothyroidism, unspecified: Secondary | ICD-10-CM | POA: Diagnosis not present

## 2024-02-18 DIAGNOSIS — E1165 Type 2 diabetes mellitus with hyperglycemia: Secondary | ICD-10-CM | POA: Diagnosis not present

## 2024-02-18 DIAGNOSIS — Z7984 Long term (current) use of oral hypoglycemic drugs: Secondary | ICD-10-CM

## 2024-02-18 DIAGNOSIS — E1169 Type 2 diabetes mellitus with other specified complication: Secondary | ICD-10-CM

## 2024-02-18 DIAGNOSIS — E785 Hyperlipidemia, unspecified: Secondary | ICD-10-CM

## 2024-02-18 LAB — LIPID PANEL
Cholesterol: 249 mg/dL — ABNORMAL HIGH (ref 0–200)
HDL: 103.1 mg/dL (ref >39.00)
LDL Cholesterol: 137 mg/dL — ABNORMAL HIGH (ref 0–99)
NonHDL: 145.54
Total CHOL/HDL Ratio: 2
Triglycerides: 43 mg/dL (ref 0.0–149.0)
VLDL: 8.6 mg/dL (ref 0.0–40.0)

## 2024-02-18 LAB — COMPREHENSIVE METABOLIC PANEL WITH GFR
ALT: 10 U/L (ref 0–35)
AST: 13 U/L (ref 0–37)
Albumin: 4.5 g/dL (ref 3.5–5.2)
Alkaline Phosphatase: 60 U/L (ref 39–117)
BUN: 20 mg/dL (ref 6–23)
CO2: 27 meq/L (ref 19–32)
Calcium: 9.8 mg/dL (ref 8.4–10.5)
Chloride: 101 meq/L (ref 96–112)
Creatinine, Ser: 0.82 mg/dL (ref 0.40–1.20)
GFR: 76.68 mL/min (ref >60.00)
Glucose, Bld: 121 mg/dL — ABNORMAL HIGH (ref 70–99)
Potassium: 4.3 meq/L (ref 3.5–5.1)
Sodium: 138 meq/L (ref 135–145)
Total Bilirubin: 0.9 mg/dL (ref 0.2–1.2)
Total Protein: 7.8 g/dL (ref 6.0–8.3)

## 2024-02-18 LAB — HEMOGLOBIN A1C: Hgb A1c MFr Bld: 7.9 % — ABNORMAL HIGH (ref 4.6–6.5)

## 2024-02-18 LAB — TSH: TSH: 3.9 u[IU]/mL (ref 0.35–5.50)

## 2024-02-18 MED ORDER — METFORMIN HCL 1000 MG PO TABS
1000.0000 mg | ORAL_TABLET | Freq: Two times a day (BID) | ORAL | 3 refills | Status: DC
  Filled 2024-02-18: qty 180, 90d supply, fill #0

## 2024-02-18 MED ORDER — LEVOTHYROXINE SODIUM 100 MCG PO TABS
100.0000 ug | ORAL_TABLET | Freq: Every day | ORAL | 1 refills | Status: AC
  Filled 2024-02-18 – 2024-04-25 (×2): qty 90, 90d supply, fill #0

## 2024-02-18 MED ORDER — EMPAGLIFLOZIN 25 MG PO TABS
25.0000 mg | ORAL_TABLET | Freq: Every day | ORAL | 1 refills | Status: AC
  Filled 2024-02-18 – 2024-06-20 (×3): qty 90, 90d supply, fill #0
  Filled 2024-06-20: qty 90, 90d supply, fill #1

## 2024-02-18 MED ORDER — SITAGLIPTIN PHOSPHATE 100 MG PO TABS
100.0000 mg | ORAL_TABLET | Freq: Every day | ORAL | 1 refills | Status: DC
  Filled 2024-02-18 – 2024-04-18 (×2): qty 90, 90d supply, fill #0

## 2024-02-18 NOTE — Patient Instructions (Signed)
 Thank you for coming in today. No change in medications at this time, but I suspect we may consider a GLP or GIP in place of the Januvia .  We certainly can delay that until after your colonoscopy if that will happen soon.  I do recommend trying Zetia , I do think you will tolerate that better than statins.  If you have side effects with Zetia , let me know and we can discuss other options.  If there are any concerns on your bloodwork, I will let you know. Take care!

## 2024-02-18 NOTE — Progress Notes (Signed)
 Subjective:  Patient ID: Jenna Carroll, female    DOB: 1960-08-23  Age: 63 y.o. MRN: [id number]  CC:  Chief Complaint  Patient presents with   Medical Management of Chronic Issues    3 Month follow up. Labs    HPI Jenna Carroll presents for   Diabetes: Complicated by hyperglycemia.  A1c has remained above 8 since October of last year.  Has been treated with metformin  1000 mg twice daily, Januvia  100 mg daily, added Jardiance  in March.  Still elevated at her June visit.  Jardiance  was maximized at 25 mg daily. Lipids were elevated but she was off her statin at last visit.  Started on Zetia , as she was experiencing myalgias/arthralgias with Lipitor, and pravastatin .  Arthralgias/myalgias improved off statins.  We did discuss option of PCSK9 inhibitor but initially requested Zetia . No new side effects, no mycotic or uti sx's. No new GI side effects.  Home readings:  120-140 fasting  No postprandials.  No lows.  Microalbumin: Normal ratio of 5.9 on 11/11/2023 Optho, foot exam, pneumovax:  Optho this year - Dr. Darroll in [place]. March 5th  Flu vaccine - planned at hospital/work. Declines covid booster.  Foot exam Planning on colonoscopy soon. Would defer GLP/GIP until after procedure.  Declines HIV screening - had done in past.  Diabetic Foot Exam - Simple   Simple Foot Form Diabetic Foot exam was performed with the following findings: Yes 02/18/2024  9:25 AM  Visual Inspection No deformities, no ulcerations, no other skin breakdown bilaterally: Yes Sensation Testing Intact to touch and monofilament testing bilaterally: Yes Pulse Check Posterior Tibialis and Dorsalis pulse intact bilaterally: Yes Comments      Lab Results  Component Value Date   HGBA1C 8.2 (H) 11/11/2023   HGBA1C 8.4 (H) 07/27/2023   HGBA1C 8.0 (H) 02/18/2023   Lab Results  Component Value Date   MICROALBUR 1.1 11/11/2023   LDLCALC 153 (H) 11/11/2023   CREATININE 0.92 11/11/2023    Hyperlipidemia: See above. Did not take zetia , concerned about possible side effects. Did not try.  Lab Results  Component Value Date   CHOL 259 (H) 11/11/2023   HDL 98.00 11/11/2023   LDLCALC 153 (H) 11/11/2023   TRIG 43.0 11/11/2023   CHOLHDL 3 11/11/2023   Lab Results  Component Value Date   ALT 10 11/11/2023   AST 15 11/11/2023   ALKPHOS 56 11/11/2023   BILITOT 1.1 11/11/2023   Hypothyroidism: Lab Results  Component Value Date   TSH 3.58 07/27/2023  Synthroid  100mcg every day Taking medication daily.  No new hot or cold intolerance. No new hair or skin changes, heart palpitations or new fatigue. No new weight changes.     History Patient Active Problem List   Diagnosis Date Noted   Superficial basal cell carcinoma 12/11/2023   Encounter for routine gynecological examination with Papanicolaou smear of cervix 04/29/2023   Hyperlipidemia associated with type 2 diabetes mellitus (HCC) 04/24/2021   Hormone replacement therapy 04/04/2021   Postmenopause 04/04/2021   Encounter for screening fecal occult blood testing 04/04/2021   Type 2 diabetes mellitus with hyperglycemia, without long-term current use of insulin (HCC) 04/04/2021   Encounter for colorectal cancer screening 10/25/2019   Lichen sclerosus et atrophicus 10/25/2019   Screening for colorectal cancer 02/24/2018   Encounter for well woman exam with routine gynecological exam 02/24/2018   Encounter for gynecological examination with Papanicolaou smear of cervix 04/08/2016   IUD (intrauterine device) in place 04/08/2016   Diabetes  mellitus type 2, diet-controlled (HCC) 09/29/2012   Microalbuminuria 09/29/2012   UTI (urinary tract infection) 09/29/2012   Skin mole 09/29/2012   Hypothyroidism 03/16/2012   Arthritis 03/16/2012   Vitamin D  deficiency 03/16/2012   Past Medical History:  Diagnosis Date   Arthritis    DM (diabetes mellitus) (HCC) 02/17/2012   Hyperlipidemia, mild    Hypothyroidism    Thyroid   disease    Past Surgical History:  Procedure Laterality Date   left ankle     TONSILLECTOMY AND ADENOIDECTOMY     WISDOM TOOTH EXTRACTION     Allergies  Allergen Reactions   Lipitor [Atorvastatin ] Other (See Comments)    Arthralgia/myalgia.    Prior to Admission medications   Medication Sig Start Date End Date Taking? Authorizing Provider  Cholecalciferol (VITAMIN D -3) 125 MCG (5000 UT) TABS Take 1 tablet by mouth daily.   Yes [provider]  clobetasol  ointment (TEMOVATE ) 0.05 % Apply 1 topically 2 (two) times daily. 04/29/23  Yes Signa Nest A, NP  Coenzyme Q10 (COQ10 PO) Take 300 mg by mouth daily.   Yes [provider]  Cyanocobalamin (VITAMIN B-12 PO) Take 5,000 mcg by mouth daily.   Yes [provider]  empagliflozin  (JARDIANCE ) 25 MG TABS tablet Take 1 tablet (25 mg total) by mouth daily. 11/14/23  Yes Levora Reyes SAUNDERS, MD  estradiol  (ESTRACE ) 1 MG tablet Take 1 tablet (1 mg total) by mouth daily. 04/21/23 04/23/24 Yes Jayne Vonn DEL, MD  ezetimibe  (ZETIA ) 10 MG tablet Take 1 tablet (10 mg total) by mouth daily. 11/11/23  Yes Levora Reyes SAUNDERS, MD  levothyroxine  (SYNTHROID ) 100 MCG tablet Take 1 tablet (100 mcg total) by mouth daily. 01/26/24  Yes Levora Reyes SAUNDERS, MD  MAGNESIUM GLUCONATE PO Take 300 mg by mouth daily.   Yes [provider]  metFORMIN  (GLUCOPHAGE ) 1000 MG tablet Take 1 tablet (1,000 mg total) by mouth 2 (two) times daily with a meal. 07/27/23  Yes Levora Reyes SAUNDERS, MD  Naproxen Sodium (ALEVE PO) Take by mouth as needed.   Yes [provider]  progesterone  (PROMETRIUM ) 200 MG capsule Take 1 capsule (200 mg total) by mouth daily. 04/21/23  Yes Jayne Vonn DEL, MD  sitaGLIPtin  (JANUVIA ) 100 MG tablet Take 1 tablet (100 mg total) by mouth daily. 11/11/23  Yes Levora Reyes SAUNDERS, MD  sodium fluoride  (SODIUM FLUORIDE  5000 PPM) 1.1 % GEL dental gel Apply 1 time per day at night; Apply pea-sized amount to toothbrush. Brush a full  three mintues. Spit out excess. Do not rinse following use 07/16/22  Yes Myra Pac, DMD   Social History   Socioeconomic History   Marital status: Married    Spouse name: Not on file   Number of children: 0   Years of education: Not on file   Highest education level: Bachelor's degree (e.g., BA, AB, BS)  Occupational History   Not on file  Tobacco Use   Smoking status: Never   Smokeless tobacco: Never  Vaping Use   Vaping status: Never Used  Substance and Sexual Activity   Alcohol use: Yes    Comment: occ   Drug use: No   Sexual activity: Yes    Birth control/protection: Post-menopausal  Other Topics Concern   Not on file  Social History Narrative   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   Social Drivers of [employer]: Low Risk  (11/11/2023)   Overall Financial Resource Strain (CARDIA)  Difficulty of Paying Living Expenses: Not very hard  Food Insecurity: No Food Insecurity (11/11/2023)   Hunger Vital Sign    Worried About Running Out of Food in the Last Year: Never true    Ran Out of Food in the Last Year: Never true  Transportation Needs: No Transportation Needs (11/11/2023)   PRAPARE - Administrator, Civil Service (Medical): No    Lack of Transportation (Non-Medical): No  Physical Activity: Sufficiently Active (11/11/2023)   Exercise Vital Sign    Days of Exercise per Week: 5 days    Minutes of Exercise per Session: 40 min  Stress: No Stress Concern Present (11/11/2023)   [employer] of Occupational Health - Occupational Stress Questionnaire    Feeling of Stress: Only a little  Social Connections: Unknown (11/11/2023)   Social Connection and Isolation Panel    Frequency of Communication with Friends and Family: Once a week    Frequency of Social Gatherings with Friends and Family: Patient declined    Attends Religious Services: Patient declined    Database administrator or Organizations: Yes     Attends Banker Meetings: 1 to 4 times per year    Marital Status: Married  Catering manager Violence: Unknown (04/29/2023)   Humiliation, Afraid, Rape, and Kick questionnaire    Fear of Current or Ex-Partner: No    Emotionally Abused: No    Physically Abused: No    Sexually Abused: Not on file    Review of Systems  Constitutional:  Negative for fatigue and unexpected weight change.  Respiratory:  Negative for chest tightness and shortness of breath.   Cardiovascular:  Negative for chest pain, palpitations and leg swelling.  Gastrointestinal:  Negative for abdominal pain and blood in stool.  Neurological:  Negative for dizziness, syncope, light-headedness and headaches.     Objective:   Vitals:   02/18/24 0832  BP: 124/72  Pulse: 79  Temp: 98.4 F (36.9 C)  SpO2: 99%  Weight: 170 lb 3.2 oz (77.2 kg)  Height: 5' 6.75 (1.695 m)     Physical Exam Vitals reviewed.  Constitutional:      Appearance: Normal appearance. She is well-developed.  HENT:     Head: Normocephalic and atraumatic.  Eyes:     Conjunctiva/sclera: Conjunctivae normal.     Pupils: Pupils are equal, round, and reactive to light.  Neck:     Vascular: No carotid bruit.  Cardiovascular:     Rate and Rhythm: Normal rate and regular rhythm.     Heart sounds: Normal heart sounds.  Pulmonary:     Effort: Pulmonary effort is normal.     Breath sounds: Normal breath sounds.  Abdominal:     Palpations: Abdomen is soft. There is no pulsatile mass.     Tenderness: There is no abdominal tenderness.  Musculoskeletal:     Right lower leg: No edema.     Left lower leg: No edema.  Skin:    General: Skin is warm and dry.  Neurological:     Mental Status: She is alert and oriented to person, place, and time.  Psychiatric:        Mood and Affect: Mood normal.        Behavior: Behavior normal.        Assessment & Plan:  Jenna Carroll is a 63 y.o. female . Hyperlipidemia associated with type  2 diabetes mellitus (HCC) - Plan: Comprehensive metabolic panel with GFR, Lipid panel  - Uncontrolled.  Did not tolerate statin -did recommend she try the statin as I think that would be better tolerated.  Also discussed option to meet with lipid specialist, PCSK9 inhibitors but she will try zetia  first.  She will let me know if any new side effects on that med.  Type 2 diabetes mellitus with hyperglycemia, without long-term current use of insulin (HCC) - Plan: empagliflozin  (JARDIANCE ) 25 MG TABS tablet, metFORMIN  (GLUCOPHAGE ) 1000 MG tablet, sitaGLIPtin  (JANUVIA ) 100 MG tablet, Hemoglobin A1c  - Uncontrolled  - unfortunately has been uncontrolled the past few visits.  Higher dose Jardiance  has been tolerated but still some elevated readings.  We discussed goals, would strongly consider GLP or GIP depending on levels today and discontinued Januvia .  She plans on anoscopy soon and due to the need for holding GLP's prior to those procedures, would like to hold off on changes for now but open to med changes after that procedure.  Will check labs and then can recommend change in regimen if needed.  Refills provided for now.  Hypothyroidism, unspecified type - Plan: levothyroxine  (SYNTHROID ) 100 MCG tablet  - Tolerating current regimen, continue same dose, check TSH and adjust plan accordingly.  Meds ordered this encounter  Medications   empagliflozin  (JARDIANCE ) 25 MG TABS tablet    Sig: Take 1 tablet (25 mg total) by mouth daily.    Dispense:  90 tablet    Refill:  1   levothyroxine  (SYNTHROID ) 100 MCG tablet    Sig: Take 1 tablet (100 mcg total) by mouth daily.    Dispense:  90 tablet    Refill:  1   metFORMIN  (GLUCOPHAGE ) 1000 MG tablet    Sig: Take 1 tablet (1,000 mg total) by mouth 2 (two) times daily with a meal.    Dispense:  180 tablet    Refill:  3   sitaGLIPtin  (JANUVIA ) 100 MG tablet    Sig: Take 1 tablet (100 mg total) by mouth daily.    Dispense:  90 tablet    Refill:  1    Patient Instructions  Thank you for coming in today. No change in medications at this time, but I suspect we may consider a GLP or GIP in place of the Januvia .  We certainly can delay that until after your colonoscopy if that will happen soon.  I do recommend trying Zetia , I do think you will tolerate that better than statins.  If you have side effects with Zetia , let me know and we can discuss other options.  If there are any concerns on your bloodwork, I will let you know. Take care!     Signed,   Reyes Pines, MD [facility], [facility] 02/18/24 9:26 AM

## 2024-02-23 ENCOUNTER — Telehealth: Payer: Self-pay

## 2024-02-23 ENCOUNTER — Ambulatory Visit: Payer: Self-pay | Admitting: Family Medicine

## 2024-02-23 DIAGNOSIS — M79672 Pain in left foot: Secondary | ICD-10-CM | POA: Diagnosis not present

## 2024-02-23 DIAGNOSIS — E119 Type 2 diabetes mellitus without complications: Secondary | ICD-10-CM | POA: Diagnosis not present

## 2024-02-23 NOTE — Telephone Encounter (Signed)
 Who is your primary care physician: Dr.Jeffrey Green  Reasons for the colonoscopy: screening   Have you had a colonoscopy before?  Yes Dr.Rehman 2011-2012  Do you have family history of colon cancer? no  Previous colonoscopy with polyps removed? no  Do you have a history colorectal cancer?   no  Are you diabetic? If yes, Type 1 or Type 2?    Yes type 2  Do you have a prosthetic or mechanical heart valve? no  Do you have a pacemaker/defibrillator?   no  Have you had endocarditis/atrial fibrillation? no  Have you had joint replacement within the last 12 months?  no  Do you tend to be constipated or have to use laxatives? no  Do you have any history of drugs or alchohol?  no  Do you use supplemental oxygen?  no  Have you had a stroke or heart attack within the last 6 months? no  Do you take weight loss medication?  no  For female patients: have you had a hysterectomy?  no                                     are you post menopausal?       yes                                            do you still have your menstrual cycle? no      Do you take any blood-thinning medications such as: (aspirin, warfarin, Plavix, Aggrenox)  no  If yes we need the name, milligram, dosage and who is prescribing doctor  Current Outpatient Medications on File Prior to Visit  Medication Sig Dispense Refill   Cholecalciferol (VITAMIN D -3) 125 MCG (5000 UT) TABS Take 1 tablet by mouth daily.     Coenzyme Q10 (COQ10 PO) Take 300 mg by mouth daily.     Cyanocobalamin (VITAMIN B-12 PO) Take 5,000 mcg by mouth daily.     empagliflozin  (JARDIANCE ) 25 MG TABS tablet Take 1 tablet (25 mg total) by mouth daily. 90 tablet 1   estradiol  (ESTRACE ) 1 MG tablet Take 1 tablet (1 mg total) by mouth daily. 360 tablet 0   ezetimibe  (ZETIA ) 10 MG tablet Take 10 mg by mouth daily.     levothyroxine  (SYNTHROID ) 100 MCG tablet Take 1 tablet (100 mcg total) by mouth daily. 90 tablet 1   MAGNESIUM GLUCONATE PO Take 300 mg  by mouth daily.     metFORMIN  (GLUCOPHAGE ) 1000 MG tablet Take 1 tablet (1,000 mg total) by mouth 2 (two) times daily with a meal. 180 tablet 3   progesterone  (PROMETRIUM ) 200 MG capsule Take 1 capsule (200 mg total) by mouth daily. 90 capsule 3   sitaGLIPtin  (JANUVIA ) 100 MG tablet Take 1 tablet (100 mg total) by mouth daily. 90 tablet 1   No current facility-administered medications on file prior to visit.    Allergies  Allergen Reactions   Lipitor [Atorvastatin ] Other (See Comments)    Arthralgia/myalgia.      Pharmacy: [facility]  Primary Insurance Name: [employer] [id number]  Best number where you can be reached: [phone]

## 2024-03-01 NOTE — Telephone Encounter (Signed)
 LMOVM to return call.

## 2024-03-01 NOTE — Telephone Encounter (Signed)
 Any room, Januvia  per protocol Thanks

## 2024-03-02 ENCOUNTER — Other Ambulatory Visit: Payer: Self-pay

## 2024-03-02 ENCOUNTER — Other Ambulatory Visit: Payer: Self-pay | Admitting: *Deleted

## 2024-03-02 ENCOUNTER — Encounter: Payer: Self-pay | Admitting: *Deleted

## 2024-03-02 MED ORDER — PEG 3350-KCL-NA BICARB-NACL 420 G PO SOLR
4000.0000 mL | Freq: Once | ORAL | 0 refills | Status: AC
  Filled 2024-03-02: qty 4000, 1d supply, fill #0

## 2024-03-02 NOTE — Telephone Encounter (Signed)
 Pt has been scheduled for 04/06/24. Instructions mailed and prep sent to pharmacy.

## 2024-03-04 ENCOUNTER — Ambulatory Visit (HOSPITAL_COMMUNITY)

## 2024-03-07 ENCOUNTER — Other Ambulatory Visit: Payer: Self-pay

## 2024-04-01 ENCOUNTER — Encounter (HOSPITAL_COMMUNITY): Payer: Self-pay

## 2024-04-01 ENCOUNTER — Ambulatory Visit (HOSPITAL_COMMUNITY)
Admission: RE | Admit: 2024-04-01 | Discharge: 2024-04-01 | Disposition: A | Discharge status: Home / Self Care | Source: Ambulatory Visit | Attending: Family Medicine | Admitting: Family Medicine

## 2024-04-01 DIAGNOSIS — Z1231 Encounter for screening mammogram for malignant neoplasm of breast: Secondary | ICD-10-CM | POA: Diagnosis present

## 2024-04-04 ENCOUNTER — Other Ambulatory Visit: Payer: Self-pay

## 2024-04-04 ENCOUNTER — Ambulatory Visit: Admitting: Adult Health

## 2024-04-04 ENCOUNTER — Ambulatory Visit (HOSPITAL_COMMUNITY)

## 2024-04-04 ENCOUNTER — Encounter: Payer: Self-pay | Admitting: Adult Health

## 2024-04-04 VITALS — BP 113/79 | HR 76 | Ht 66.0 in | Wt 168.5 lb

## 2024-04-04 DIAGNOSIS — Z79890 Hormone replacement therapy (postmenopausal): Secondary | ICD-10-CM | POA: Diagnosis not present

## 2024-04-04 MED ORDER — ESTRADIOL 1 MG PO TABS
1.0000 mg | ORAL_TABLET | Freq: Every day | ORAL | 3 refills | Status: AC
  Filled 2024-04-04: qty 90, 90d supply, fill #0

## 2024-04-04 MED ORDER — CLOBETASOL PROPIONATE 0.05 % EX CREA
TOPICAL_CREAM | CUTANEOUS | 3 refills | Status: AC
  Filled 2024-04-04: qty 30, 15d supply, fill #0
  Filled 2024-04-18: qty 30, 30d supply, fill #1
  Filled 2024-05-16: qty 30, 30d supply, fill #2
  Filled 2024-06-20: qty 30, 30d supply, fill #3

## 2024-04-04 MED ORDER — PROGESTERONE 200 MG PO CAPS
200.0000 mg | ORAL_CAPSULE | Freq: Every day | ORAL | 3 refills | Status: AC
  Filled 2024-04-04: qty 90, 90d supply, fill #0

## 2024-04-04 NOTE — Progress Notes (Signed)
  Subjective:     Patient ID: Jenna Carroll, female   DOB: January 10, 1961, 63 y.o.   MRN: [id number]  HPI Jenna Carroll is a 63 year old white female, married, PM in to get refills on estrace  1 mg and Prometrium  200 mg and clobetasol . She is going well and working prn now, and caring for elderly parents.     Component Value Date/Time   DIAGPAP  04/29/2023 0919    - Negative for intraepithelial lesion or malignancy (NILM)   DIAGPAP  10/25/2019 1014    - Negative for intraepithelial lesion or malignancy (NILM)   DIAGPAP  04/08/2016 0000    NEGATIVE FOR INTRAEPITHELIAL LESIONS OR MALIGNANCY.   HPVHIGH Negative 04/29/2023 0919   HPVHIGH Negative 10/25/2019 1014   ADEQPAP  04/29/2023 0919    Satisfactory for evaluation; transformation zone component ABSENT.   ADEQPAP  10/25/2019 1014    Satisfactory for evaluation; transformation zone component PRESENT.   ADEQPAP  04/08/2016 0000    Satisfactory for evaluation  endocervical/transformation zone component PRESENT.    PCP is Dr Levora Review of Systems No vaginal bleeding  Doing well, no complaints Reviewed past medical,surgical, social and family history. Reviewed medications and allergies.     Objective:   Physical Exam BP 113/79 (BP Location: Right Arm, Patient Position: Sitting, Cuff Size: Normal)   Pulse 76   Ht 5' 6 (1.676 m)   Wt 168 lb 8 oz (76.4 kg)   LMP 11/07/2015   BMI 27.20 kg/m     Skin warm and dry.  Lungs: clear to ausculation bilaterally. Cardiovascular: regular rate and rhythm.   Upstream - 04/04/24 0846       Pregnancy Intention Screening   Does the patient want to become pregnant in the next year? N/A    Does the patient's partner want to become pregnant in the next year? N/A    Would the patient like to discuss contraceptive options today? N/A      Contraception Wrap Up   Current Method Post-Menopause    End Method Post-Menopause    Contraception Counseling Provided No          Assessment:     1. Hormone  replacement therapy (Primary) +happy with HRT, will refill and refilled clobetasol  at her request  Meds ordered this encounter  Medications   progesterone  (PROMETRIUM ) 200 MG capsule    Sig: Take 1 capsule (200 mg total) by mouth daily.    Dispense:  90 capsule    Refill:  3    Supervising Provider:   JAYNE MINDER H [[id number]]   estradiol  (ESTRACE ) 1 MG tablet    Sig: Take 1 tablet (1 mg total) by mouth daily.    Dispense:  90 tablet    Refill:  3    Supervising Provider:   JAYNE MINDER DEL [[id number]]   clobetasol  cream (TEMOVATE ) 0.05 %    Sig: Apply topically 2-3 x weekly    Dispense:  30 g    Refill:  3    Supervising Provider:   JAYNE MINDER DEL [[id number]]   Labs with PCP Physical with PCP Had mammogram 04/01/24 not read yet Colonoscopy in near future     Plan:     Follow up in 1 year

## 2024-04-06 ENCOUNTER — Encounter (HOSPITAL_COMMUNITY): Payer: Self-pay | Admitting: Gastroenterology

## 2024-04-06 ENCOUNTER — Ambulatory Visit (HOSPITAL_COMMUNITY): Admitting: Anesthesiology

## 2024-04-06 ENCOUNTER — Encounter (HOSPITAL_COMMUNITY)
Admission: RE | Disposition: A | Discharge status: Home / Self Care | Payer: Self-pay | Source: Home / Self Care | Attending: Gastroenterology

## 2024-04-06 ENCOUNTER — Ambulatory Visit (HOSPITAL_COMMUNITY)
Admission: RE | Admit: 2024-04-06 | Discharge: 2024-04-06 | Disposition: A | Discharge status: Home / Self Care | Attending: Gastroenterology | Admitting: Gastroenterology

## 2024-04-06 ENCOUNTER — Encounter (INDEPENDENT_AMBULATORY_CARE_PROVIDER_SITE_OTHER): Payer: Self-pay | Admitting: *Deleted

## 2024-04-06 ENCOUNTER — Other Ambulatory Visit: Payer: Self-pay

## 2024-04-06 DIAGNOSIS — D122 Benign neoplasm of ascending colon: Secondary | ICD-10-CM

## 2024-04-06 DIAGNOSIS — M1990 Unspecified osteoarthritis, unspecified site: Secondary | ICD-10-CM | POA: Insufficient documentation

## 2024-04-06 DIAGNOSIS — D120 Benign neoplasm of cecum: Secondary | ICD-10-CM

## 2024-04-06 DIAGNOSIS — D125 Benign neoplasm of sigmoid colon: Secondary | ICD-10-CM | POA: Diagnosis not present

## 2024-04-06 DIAGNOSIS — E785 Hyperlipidemia, unspecified: Secondary | ICD-10-CM | POA: Insufficient documentation

## 2024-04-06 DIAGNOSIS — Z833 Family history of diabetes mellitus: Secondary | ICD-10-CM | POA: Insufficient documentation

## 2024-04-06 DIAGNOSIS — K648 Other hemorrhoids: Secondary | ICD-10-CM | POA: Diagnosis not present

## 2024-04-06 DIAGNOSIS — Z79890 Hormone replacement therapy (postmenopausal): Secondary | ICD-10-CM | POA: Diagnosis not present

## 2024-04-06 DIAGNOSIS — Z1211 Encounter for screening for malignant neoplasm of colon: Secondary | ICD-10-CM | POA: Insufficient documentation

## 2024-04-06 DIAGNOSIS — Z7984 Long term (current) use of oral hypoglycemic drugs: Secondary | ICD-10-CM | POA: Insufficient documentation

## 2024-04-06 DIAGNOSIS — E039 Hypothyroidism, unspecified: Secondary | ICD-10-CM | POA: Insufficient documentation

## 2024-04-06 DIAGNOSIS — Z139 Encounter for screening, unspecified: Secondary | ICD-10-CM | POA: Diagnosis not present

## 2024-04-06 DIAGNOSIS — E119 Type 2 diabetes mellitus without complications: Secondary | ICD-10-CM | POA: Diagnosis not present

## 2024-04-06 DIAGNOSIS — K635 Polyp of colon: Secondary | ICD-10-CM | POA: Diagnosis not present

## 2024-04-06 HISTORY — PX: COLONOSCOPY: SHX5424

## 2024-04-06 LAB — HM COLONOSCOPY

## 2024-04-06 LAB — GLUCOSE, CAPILLARY
Glucose-Capillary: 66 mg/dL — ABNORMAL LOW (ref 70–99)
Glucose-Capillary: 112 mg/dL — ABNORMAL HIGH (ref 70–99)

## 2024-04-06 SURGERY — COLONOSCOPY
Anesthesia: General

## 2024-04-06 MED ORDER — PROPOFOL 10 MG/ML IV BOLUS
INTRAVENOUS | Status: DC | PRN
  Administered 2024-04-06: 100 mg via INTRAVENOUS

## 2024-04-06 MED ORDER — LIDOCAINE 2% (20 MG/ML) 5 ML SYRINGE
INTRAMUSCULAR | Status: DC | PRN
  Administered 2024-04-06: 60 mg via INTRAVENOUS

## 2024-04-06 MED ORDER — LACTATED RINGERS IV SOLN: INTRAVENOUS | Status: DC

## 2024-04-06 MED ORDER — PROPOFOL 500 MG/50ML IV EMUL
INTRAVENOUS | Status: DC | PRN
  Administered 2024-04-06: 200 ug/kg/min via INTRAVENOUS

## 2024-04-06 MED ORDER — DEXTROSE 50 % IV SOLN
INTRAVENOUS | Status: AC
  Filled 2024-04-06: qty 50

## 2024-04-06 MED ORDER — DEXTROSE 50 % IV SOLN
25.0000 mL | Freq: Once | INTRAVENOUS | Status: AC
  Administered 2024-04-06: 25 mL via INTRAVENOUS

## 2024-04-06 NOTE — Op Note (Signed)
 [facility] Patient Name: Jenna Carroll Procedure Date: 04/06/2024 8:49 AM MRN: [id number] Date of Birth: [birth year] Attending MD: Toribio Fortune , , [id number] CSN: [id number] Age: 63 Admit Type: Outpatient Procedure:                Colonoscopy Indications:              Screening for colorectal malignant neoplasm Providers:                Toribio Fortune, Madelin Hunter, RN, Bascom Blush Referring MD:              Medicines:                Monitored Anesthesia Care Complications:            No immediate complications. Estimated Blood Loss:     Estimated blood loss: none. Procedure:                Pre-Anesthesia Assessment:                           - Prior to the procedure, a History and Physical                            was performed, and patient medications, allergies                            and sensitivities were reviewed. The patient's                            tolerance of previous anesthesia was reviewed.                           - The risks and benefits of the procedure and the                            sedation options and risks were discussed with the                            patient. All questions were answered and informed                            consent was obtained.                           - ASA Grade Assessment: II - A patient with mild                            systemic disease.                           After obtaining informed consent, the colonoscope                            was passed under direct vision. Throughout the                            procedure, the patient's blood  pressure, pulse, and                            oxygen saturations were monitored continuously. The                            [id number] ([id number]) Peds Colon was introduced                            through the anus and advanced to the the cecum,                            identified by appendiceal orifice and ileocecal                            valve. The  colonoscopy was performed without                            difficulty. The patient tolerated the procedure                            well. The quality of the bowel preparation was good. Scope In: 9:09:01 AM Scope Out: 9:27:19 AM Scope Withdrawal Time: 0 hours 12 minutes 28 seconds  Total Procedure Duration: 0 hours 18 minutes 18 seconds  Findings:      The perianal and digital rectal examinations were normal.      Four sessile polyps were found in the sigmoid colon, ascending colon and       cecum. The polyps were 3 to 5 mm in size. These polyps were removed with       a cold snare. Resection and retrieval were complete.      Non-bleeding internal hemorrhoids were found during retroflexion. The       hemorrhoids were small. Impression:               - Four 3 to 5 mm polyps in the sigmoid colon, in                            the ascending colon and in the cecum, removed with                            a cold snare. Resected and retrieved.                           - Non-bleeding internal hemorrhoids. Moderate Sedation:      Per Anesthesia Care Recommendation:           - Discharge patient to home (ambulatory).                           - Resume previous diet.                           - Await pathology results.                           - Repeat colonoscopy for  surveillance based on                            pathology results. Procedure Code(s):        --- Professional ---                           [id number], Colonoscopy, flexible; with removal of                            tumor(s), polyp(s), or other lesion(s) by snare                            technique Diagnosis Code(s):        --- Professional ---                           Z12.11, Encounter for screening for malignant                            neoplasm of colon                           D12.5, Benign neoplasm of sigmoid colon                           D12.2, Benign neoplasm of ascending colon                           D12.0,  Benign neoplasm of cecum                           K64.8, Other hemorrhoids CPT copyright 2022 American Medical Association. All rights reserved. The codes documented in this report are preliminary and upon coder review may  be revised to meet current compliance requirements. Toribio Fortune, MD Toribio Fortune,  04/06/2024 9:30:38 AM This report has been signed electronically. Number of Addenda: 0

## 2024-04-06 NOTE — Anesthesia Postprocedure Evaluation (Signed)
 Anesthesia Post Note  Patient: Jenna Carroll  Procedure(s) Performed: COLONOSCOPY  Patient location during evaluation: Endoscopy Anesthesia Type: General Level of consciousness: awake and alert Pain management: pain level controlled Vital Signs Assessment: post-procedure vital signs reviewed and stable Respiratory status: spontaneous breathing, nonlabored ventilation and respiratory function stable Cardiovascular status: stable Anesthetic complications: no   There were no known notable events for this encounter.   Last Vitals:  Vitals:   04/06/24 0755 04/06/24 0933  BP: 128/80 (!) 94/55  Pulse: 93 86  Resp: 17 18  Temp: 36.5 C 36.4 C  SpO2: 100%     Last Pain:  Vitals:   04/06/24 0933  TempSrc:   PainSc: 0-No pain                 Emilianna Barlowe L Midas Daughety

## 2024-04-06 NOTE — Transfer of Care (Signed)
 Immediate Anesthesia Transfer of Care Note  Patient: Jenna Carroll  Procedure(s) Performed: COLONOSCOPY  Patient Location: Endoscopy Unit  Anesthesia Type:General  Level of Consciousness: awake, alert , oriented, and patient cooperative  Airway & Oxygen Therapy: Patient Spontanous Breathing  Post-op Assessment: Report given to RN, Post -op Vital signs reviewed and stable, and Patient moving all extremities X 4  Post vital signs: Reviewed and stable  Last Vitals:  Vitals Value Taken Time  BP 94/55 04/06/24 09:33  Temp 36.4 C 04/06/24 09:33  Pulse 86 04/06/24 09:33  Resp 18 04/06/24 09:33  SpO2      Last Pain:  Vitals:   04/06/24 0933  TempSrc:   PainSc: 0-No pain      Patients Stated Pain Goal: 7 (04/06/24 0755)  Complications: No notable events documented.

## 2024-04-06 NOTE — H&P (Signed)
 Jenna Carroll is an 63 y.o. female.   Chief Complaint: Colorectal cancer screening HPI: 63 year old female with past medical history of diabetes, hyperlipidemia, hypothyroidism, arthritis, coming for screening colonoscopy.  Last colonoscopy performed around 2009, reports that this was normal.  The patient denies having any complaints such as melena, hematochezia, abdominal pain or distention, change in her bowel movement consistency or frequency, no changes in weight recently.  No family history of colorectal cancer.   Past Medical History:  Diagnosis Date   Arthritis    DM (diabetes mellitus) (HCC) 02/17/2012   Hyperlipidemia, mild    Hypothyroidism    Thyroid  disease     Past Surgical History:  Procedure Laterality Date   left ankle     TONSILLECTOMY AND ADENOIDECTOMY     WISDOM TOOTH EXTRACTION      Family History  Problem Relation Age of Onset   Kidney disease Mother    Diabetes Mother    Heart disease Mother    Hypertension Mother    Hyperlipidemia Mother    Osteoporosis Mother    Heart attack Mother    Heart disease Father    Arthritis Father        Rheumatoid   Prostate cancer Father    Hyperlipidemia Brother    Other Maternal Grandmother        passed away after miscarriage   Leukemia Maternal Grandfather    Aneurysm Paternal Grandmother    Social History:  reports that she has never smoked. She has never used smokeless tobacco. She reports current alcohol use. She reports that she does not use drugs.  Allergies:  Allergies  Allergen Reactions   Lipitor [Atorvastatin ] Other (See Comments)    Arthralgia/myalgia.     Medications Prior to Admission  Medication Sig Dispense Refill   Cholecalciferol (VITAMIN D -3) 125 MCG (5000 UT) TABS Take 1 tablet by mouth daily.     Coenzyme Q10 (COQ10 PO) Take 300 mg by mouth daily.     Cyanocobalamin (VITAMIN B-12 PO) Take 5,000 mcg by mouth daily.     estradiol  (ESTRACE ) 1 MG tablet Take 1 tablet (1 mg total) by mouth  daily. 90 tablet 3   ezetimibe  (ZETIA ) 10 MG tablet Take 10 mg by mouth daily.     levothyroxine  (SYNTHROID ) 100 MCG tablet Take 1 tablet (100 mcg total) by mouth daily. 90 tablet 1   MAGNESIUM GLUCONATE PO Take 300 mg by mouth daily.     metFORMIN  (GLUCOPHAGE ) 1000 MG tablet Take 1 tablet (1,000 mg total) by mouth 2 (two) times daily with a meal. 180 tablet 3   progesterone  (PROMETRIUM ) 200 MG capsule Take 1 capsule (200 mg total) by mouth daily. 90 capsule 3   sitaGLIPtin  (JANUVIA ) 100 MG tablet Take 1 tablet (100 mg total) by mouth daily. 90 tablet 1   clobetasol  cream (TEMOVATE ) 0.05 % Apply topically 2-3 x weekly 30 g 3   empagliflozin  (JARDIANCE ) 25 MG TABS tablet Take 1 tablet (25 mg total) by mouth daily. 90 tablet 1    Results for orders placed or performed during the hospital encounter of 04/06/24 (from the past 48 hours)  Glucose, capillary     Status: Abnormal   Collection Time: 04/06/24  8:03 AM  Result Value Ref Range   Glucose-Capillary 66 (L) 70 - 99 mg/dL    Comment: Glucose reference range applies only to samples taken after fasting for at least 8 hours.   No results found.  Review of Systems  All other  systems reviewed and are negative.   Blood pressure 128/80, pulse 93, temperature 97.7 F (36.5 C), temperature source Oral, resp. rate 17, last menstrual period 11/07/2015, SpO2 100%. Physical Exam  GENERAL: The patient is AO x3, in no acute distress. HEENT: Head is normocephalic and atraumatic. EOMI are intact. Mouth is well hydrated and without lesions. NECK: Supple. No masses LUNGS: Clear to auscultation. No presence of rhonchi/wheezing/rales. Adequate chest expansion HEART: RRR, normal s1 and s2. ABDOMEN: Soft, nontender, no guarding, no peritoneal signs, and nondistended. BS +. No masses. EXTREMITIES: Without any cyanosis, clubbing, rash, lesions or edema. NEUROLOGIC: AOx3, no focal motor deficit. SKIN: no jaundice, no rashes  Assessment/Plan 63 year old  female with past medical history of diabetes, hyperlipidemia, hypothyroidism, arthritis, coming for screening colonoscopy.  The patient is at average risk for colorectal cancer.  We will proceed with colonoscopy today.   Toribio Eartha Flavors, MD 04/06/2024, 8:17 AM

## 2024-04-06 NOTE — Discharge Instructions (Signed)
 You are being discharged to home.  Resume your previous diet.  We are waiting for your pathology results.  Your physician has recommended a repeat colonoscopy for surveillance based on pathology results.

## 2024-04-06 NOTE — Anesthesia Preprocedure Evaluation (Signed)
 Anesthesia Evaluation  Patient identified by MRN, date of birth, ID band Patient awake    Reviewed: Allergy & Precautions, H&P , NPO status , Patient's Chart, lab work & pertinent test results, reviewed documented beta blocker date and time   Airway Mallampati: II  TM Distance: >3 FB Neck ROM: full    Dental no notable dental hx. (+) Dental Advisory Given, Teeth Intact   Pulmonary neg pulmonary ROS   Pulmonary exam normal breath sounds clear to auscultation       Cardiovascular Exercise Tolerance: Good negative cardio ROS Normal cardiovascular exam Rhythm:regular Rate:Normal     Neuro/Psych negative neurological ROS  negative psych ROS   GI/Hepatic negative GI ROS, Neg liver ROS,,,  Endo/Other  diabetes, Type 2Hypothyroidism    Renal/GU negative Renal ROS  negative genitourinary   Musculoskeletal  (+) Arthritis , Osteoarthritis,    Abdominal   Peds  Hematology negative hematology ROS (+)   Anesthesia Other Findings   Reproductive/Obstetrics negative OB ROS                              Anesthesia Physical Anesthesia Plan  ASA: 2  Anesthesia Plan: General   Post-op Pain Management: Minimal or no pain anticipated   Induction: Intravenous  PONV Risk Score and Plan: Propofol  infusion  Airway Management Planned: Natural Airway and Nasal Cannula  Additional Equipment: None  Intra-op Plan:   Post-operative Plan:   Informed Consent: I have reviewed the patients History and Physical, chart, labs and discussed the procedure including the risks, benefits and alternatives for the proposed anesthesia with the patient or authorized representative who has indicated his/her understanding and acceptance.     Dental Advisory Given  Plan Discussed with: CRNA  Anesthesia Plan Comments:         Anesthesia Quick Evaluation

## 2024-04-07 ENCOUNTER — Ambulatory Visit (INDEPENDENT_AMBULATORY_CARE_PROVIDER_SITE_OTHER): Payer: Self-pay | Admitting: Gastroenterology

## 2024-04-07 LAB — SURGICAL PATHOLOGY

## 2024-04-08 ENCOUNTER — Encounter (HOSPITAL_COMMUNITY): Payer: Self-pay | Admitting: Gastroenterology

## 2024-04-08 NOTE — Progress Notes (Signed)
 5 yr TCS noted in recall Patient result letter mailed procedure note and pathology result faxed to PCP

## 2024-04-18 ENCOUNTER — Other Ambulatory Visit (HOSPITAL_COMMUNITY): Payer: Self-pay

## 2024-04-18 ENCOUNTER — Other Ambulatory Visit: Payer: Self-pay

## 2024-04-25 ENCOUNTER — Encounter: Payer: Self-pay | Admitting: Pharmacist

## 2024-04-25 ENCOUNTER — Other Ambulatory Visit: Payer: Self-pay

## 2024-05-16 ENCOUNTER — Other Ambulatory Visit: Payer: Self-pay

## 2024-05-20 ENCOUNTER — Encounter (HOSPITAL_COMMUNITY): Payer: Self-pay | Admitting: Pharmacist

## 2024-05-20 ENCOUNTER — Encounter: Payer: Self-pay | Admitting: Family Medicine

## 2024-05-20 ENCOUNTER — Other Ambulatory Visit (HOSPITAL_COMMUNITY): Payer: Self-pay

## 2024-05-20 ENCOUNTER — Ambulatory Visit: Admitting: Family Medicine

## 2024-05-20 ENCOUNTER — Other Ambulatory Visit: Payer: Self-pay

## 2024-05-20 VITALS — BP 108/64 | HR 60 | Temp 98.0°F | Resp 13 | Ht 66.0 in | Wt 165.2 lb

## 2024-05-20 DIAGNOSIS — E1165 Type 2 diabetes mellitus with hyperglycemia: Secondary | ICD-10-CM

## 2024-05-20 DIAGNOSIS — E1169 Type 2 diabetes mellitus with other specified complication: Secondary | ICD-10-CM | POA: Diagnosis not present

## 2024-05-20 DIAGNOSIS — E039 Hypothyroidism, unspecified: Secondary | ICD-10-CM | POA: Diagnosis not present

## 2024-05-20 DIAGNOSIS — R002 Palpitations: Secondary | ICD-10-CM

## 2024-05-20 DIAGNOSIS — Z7984 Long term (current) use of oral hypoglycemic drugs: Secondary | ICD-10-CM | POA: Diagnosis not present

## 2024-05-20 DIAGNOSIS — E785 Hyperlipidemia, unspecified: Secondary | ICD-10-CM

## 2024-05-20 LAB — LIPID PANEL
Cholesterol: 221 mg/dL — ABNORMAL HIGH (ref 28–200)
HDL: 96.3 mg/dL
LDL Cholesterol: 116 mg/dL — ABNORMAL HIGH (ref 10–99)
NonHDL: 124.91
Total CHOL/HDL Ratio: 2
Triglycerides: 46 mg/dL (ref 10.0–149.0)
VLDL: 9.2 mg/dL (ref 0.0–40.0)

## 2024-05-20 LAB — CBC
HCT: 43.6 % (ref 36.0–46.0)
Hemoglobin: 14.4 g/dL (ref 12.0–15.0)
MCHC: 32.9 g/dL (ref 30.0–36.0)
MCV: 96.1 fl (ref 78.0–100.0)
Platelets: 332 K/uL (ref 150.0–400.0)
RBC: 4.54 Mil/uL (ref 3.87–5.11)
RDW: 14.4 % (ref 11.5–15.5)
WBC: 5.3 K/uL (ref 4.0–10.5)

## 2024-05-20 LAB — COMPREHENSIVE METABOLIC PANEL WITH GFR
ALT: 12 U/L (ref 3–35)
AST: 12 U/L (ref 5–37)
Albumin: 4.6 g/dL (ref 3.5–5.2)
Alkaline Phosphatase: 65 U/L (ref 39–117)
BUN: 21 mg/dL (ref 6–23)
CO2: 27 meq/L (ref 19–32)
Calcium: 9.4 mg/dL (ref 8.4–10.5)
Chloride: 101 meq/L (ref 96–112)
Creatinine, Ser: 0.7 mg/dL (ref 0.40–1.20)
GFR: 92.55 mL/min
Glucose, Bld: 110 mg/dL — ABNORMAL HIGH (ref 70–99)
Potassium: 4.3 meq/L (ref 3.5–5.1)
Sodium: 138 meq/L (ref 135–145)
Total Bilirubin: 1.1 mg/dL (ref 0.2–1.2)
Total Protein: 7.5 g/dL (ref 6.0–8.3)

## 2024-05-20 LAB — HEMOGLOBIN A1C: Hgb A1c MFr Bld: 8.1 % — ABNORMAL HIGH (ref 4.6–6.5)

## 2024-05-20 LAB — TSH: TSH: 3.57 u[IU]/mL (ref 0.35–5.50)

## 2024-05-20 MED ORDER — METFORMIN HCL 1000 MG PO TABS
1000.0000 mg | ORAL_TABLET | Freq: Two times a day (BID) | ORAL | 3 refills | Status: AC
  Filled 2024-05-20 – 2024-05-27 (×2): qty 180, 90d supply, fill #0

## 2024-05-20 NOTE — Progress Notes (Signed)
 "  Subjective:  Patient ID: Jenna Carroll, female    DOB: 1960-11-01  Age: 64 y.o. MRN: [id number]  CC:  Chief Complaint  Patient presents with   Hyperlipidemia    No questions or concerns.    Diabetes    HPI Jenna Carroll presents for   Diabetes: Complicated by hyperglycemia.  Treated with metformin , Januvia , Jardiance , added in March of last year.  Jardiance  has been maximized at 25 mg daily, Januvia  100 mg, metformin  1000 mg twice daily.  Recommended switching her Januvia  to a GLP-1 after her October labs with still elevated A1c.  Has not made that change.  Continues on meds above. Wanted to hold off change until after colonoscopy. Agrees to change if needed.  Home readings fasting: 134 this morning. Range of 99-212(at time of illness). Low of 60 at time of colonoscopy prep in November.  Readings postprandial: - none.  No symptomatic lows recently.  Previously tried statin, myalgias/arthralgias with Lipitor and pravastatin .  Changed to Zetia .  We have discussed option of PCSK9 inhibitor but she requested Zetia  eventually. No side effects with meds.  Microalbumin: Normal ratio 11/11/2023 Optho, foot exam, pneumovax:  Flu vaccine: at work.   Lab Results  Component Value Date   HGBA1C 7.9 (H) 02/18/2024   HGBA1C 8.2 (H) 11/11/2023   HGBA1C 8.4 (H) 07/27/2023   Lab Results  Component Value Date   MICROALBUR 1.1 11/11/2023   LDLCALC 137 (H) 02/18/2024   CREATININE 0.82 02/18/2024   Hypothyroidism: Lab Results  Component Value Date   TSH 3.90 02/18/2024  New generic prescribed for meds in December.  6-week recheck TSH recommended.  Has been on the new generic past month.  Synthroid  100 mcg daily. Taking medication daily.  No new hot or cold intolerance. No new hair or skin changes, few heart palpitations - on and off for a few weeks - 10-12 beats, then resolves. Similar in past with change in thyroid  meds. Sx's last night - few mimosas yesterday - new for her. No chest pain.  No syncope/near-syncope.   No new fatigue. No new weight changes.   On HRT by GYN, last visit in November.   History Patient Active Problem List   Diagnosis Date Noted   Superficial basal cell carcinoma 12/11/2023   Special screening for malignant neoplasms, colon 04/29/2023   Hyperlipidemia associated with type 2 diabetes mellitus (HCC) 04/24/2021   Hormone replacement therapy 04/04/2021   Postmenopause 04/04/2021   Encounter for screening fecal occult blood testing 04/04/2021   Type 2 diabetes mellitus with hyperglycemia, without long-term current use of insulin (HCC) 04/04/2021   Encounter for colorectal cancer screening 10/25/2019   Lichen sclerosus et atrophicus 10/25/2019   Screening for colorectal cancer 02/24/2018   Encounter for well woman exam with routine gynecological exam 02/24/2018   Encounter for gynecological examination with Papanicolaou smear of cervix 04/08/2016   IUD (intrauterine device) in place 04/08/2016   Diabetes mellitus type 2, diet-controlled (HCC) 09/29/2012   Microalbuminuria 09/29/2012   UTI (urinary tract infection) 09/29/2012   Skin mole 09/29/2012   Hypothyroidism 03/16/2012   Arthritis 03/16/2012   Vitamin D  deficiency 03/16/2012   Past Medical History:  Diagnosis Date   Arthritis    DM (diabetes mellitus) (HCC) 02/17/2012   Hyperlipidemia, mild    Hypothyroidism    Thyroid  disease    Past Surgical History:  Procedure Laterality Date   COLONOSCOPY N/A 04/06/2024   Procedure: COLONOSCOPY;  Surgeon: Eartha Angelia Sieving, MD;  Location:  [facility];  Service: Gastroenterology;  Laterality: N/A;  9:00 AM, ASA 1-2   left ankle     TONSILLECTOMY AND ADENOIDECTOMY     WISDOM TOOTH EXTRACTION     Allergies[1] Prior to Admission medications  Medication Sig Start Date End Date Taking? Authorizing Provider  Cholecalciferol (VITAMIN D -3) 125 MCG (5000 UT) TABS Take 1 tablet by mouth daily.   Yes [provider]  clobetasol  cream  (TEMOVATE ) 0.05 % Apply topically 2-3 times a week 04/04/24  Yes Signa Nest A, NP  Coenzyme Q10 (COQ10 PO) Take 300 mg by mouth daily.   Yes [provider]  Cyanocobalamin (VITAMIN B-12 PO) Take 5,000 mcg by mouth daily.   Yes [provider]  empagliflozin  (JARDIANCE ) 25 MG TABS tablet Take 1 tablet (25 mg total) by mouth daily. 02/18/24  Yes Levora Reyes SAUNDERS, MD  estradiol  (ESTRACE ) 1 MG tablet Take 1 tablet (1 mg total) by mouth daily. 04/04/24 04/04/25 Yes Signa Nest LABOR, NP  ezetimibe  (ZETIA ) 10 MG tablet Take 10 mg by mouth daily.   Yes [provider]  levothyroxine  (SYNTHROID ) 100 MCG tablet Take 1 tablet (100 mcg total) by mouth daily. 02/18/24  Yes Levora Reyes SAUNDERS, MD  MAGNESIUM GLUCONATE PO Take 300 mg by mouth daily.   Yes [provider]  metFORMIN  (GLUCOPHAGE ) 1000 MG tablet Take 1 tablet (1,000 mg total) by mouth 2 (two) times daily with a meal. 02/18/24  Yes Levora Reyes SAUNDERS, MD  progesterone  (PROMETRIUM ) 200 MG capsule Take 1 capsule (200 mg total) by mouth daily. 04/04/24  Yes Signa Nest A, NP  sitaGLIPtin  (JANUVIA ) 100 MG tablet Take 1 tablet (100 mg total) by mouth daily. 02/18/24  Yes Levora Reyes SAUNDERS, MD   Social History   Socioeconomic History   Marital status: Married    Spouse name: Not on file   Number of children: 0   Years of education: Not on file   Highest education level: Bachelor's degree (e.g., BA, AB, BS)  Occupational History   Not on file  Tobacco Use   Smoking status: Never   Smokeless tobacco: Never  Vaping Use   Vaping status: Never Used  Substance and Sexual Activity   Alcohol use: Yes    Comment: occ   Drug use: No   Sexual activity: Yes    Birth control/protection: Post-menopausal  Other Topics Concern   Not on file  Social History Narrative   Married for 37 years.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   Social Drivers of Health   Tobacco Use: Low Risk (05/20/2024)   Patient  History    Smoking Tobacco Use: Never    Smokeless Tobacco Use: Never    Passive Exposure: Not on file  Financial Resource Strain: Low Risk (11/11/2023)   Overall Financial Resource Strain (CARDIA)    Difficulty of Paying Living Expenses: Not very hard  Food Insecurity: No Food Insecurity (11/11/2023)   Epic    Worried About Radiation Protection Practitioner of Food in the Last Year: Never true    Ran Out of Food in the Last Year: Never true  Transportation Needs: No Transportation Needs (11/11/2023)   Epic    Lack of Transportation (Medical): No    Lack of Transportation (Non-Medical): No  Physical Activity: Sufficiently Active (11/11/2023)   Exercise Vital Sign    Days of Exercise per Week: 5 days    Minutes of Exercise per Session: 40 min  Stress: No Stress Concern Present (11/11/2023)   [employer]  [employer] - Occupational Stress Questionnaire    Feeling of Stress: Only a little  Social Connections: Unknown (11/11/2023)   Social Connection and Isolation Panel    Frequency of Communication with Friends and Family: Once a week    Frequency of Social Gatherings with Friends and Family: Patient declined    Attends Religious Services: Patient declined    Database Administrator or Organizations: Yes    Attends Banker Meetings: 1 to 4 times per year    Marital Status: Married  Catering Manager Violence: Unknown (04/29/2023)   Humiliation, Afraid, Rape, and Kick questionnaire    Fear of Current or Ex-Partner: No    Emotionally Abused: No    Physically Abused: No    Sexually Abused: Not on file  Depression (PHQ2-9): Low Risk (05/20/2024)   Depression (PHQ2-9)    PHQ-2 Score: 3  Alcohol Screen: Low Risk (11/11/2023)   Alcohol Screen    Last Alcohol Screening Score (AUDIT): 2  Housing: Low Risk (11/11/2023)   Epic    Unable to Pay for Housing in the Last Year: No    Number of Times Moved in the Last Year: 1    Homeless in the Last Year: No  Utilities: Not At Risk (04/29/2023)    [facility] Utilities    Threatened with loss of utilities: No  Health Literacy: Not on file    Review of Systems  Constitutional:  Negative for fatigue and unexpected weight change.  Respiratory:  Negative for chest tightness and shortness of breath.   Cardiovascular:  Positive for palpitations. Negative for chest pain and leg swelling.  Gastrointestinal:  Negative for abdominal pain and blood in stool.  Neurological:  Negative for dizziness, syncope, light-headedness and headaches.     Objective:   Vitals:   05/20/24 0917  BP: 108/64  Pulse: 60  Resp: 13  Temp: 98 F (36.7 C)  TempSrc: Temporal  SpO2: 97%  Weight: 165 lb 3.2 oz (74.9 kg)  Height: 5' 6 (1.676 m)     Physical Exam Vitals reviewed.  Constitutional:      Appearance: Normal appearance. She is well-developed.  HENT:     Head: Normocephalic and atraumatic.  Eyes:     Conjunctiva/sclera: Conjunctivae normal.     Pupils: Pupils are equal, round, and reactive to light.  Neck:     Vascular: No carotid bruit.  Cardiovascular:     Rate and Rhythm: Normal rate and regular rhythm.     Heart sounds: Normal heart sounds.  Pulmonary:     Effort: Pulmonary effort is normal.     Breath sounds: Normal breath sounds.  Abdominal:     Palpations: Abdomen is soft. There is no pulsatile mass.     Tenderness: There is no abdominal tenderness.  Musculoskeletal:     Right lower leg: No edema.     Left lower leg: No edema.  Skin:    General: Skin is warm and dry.  Neurological:     Mental Status: She is alert and oriented to person, place, and time.  Psychiatric:        Mood and Affect: Mood normal.        Behavior: Behavior normal.    EKG, sinus bradycardia with rate 59, PR 152, QTc 453.  No acute ST or T wave changes or acute findings appreciated, no prior EKG available for comparison.    Assessment & Plan:  Jenna Carroll is a 64 y.o. female . Palpitations -  Plan: CBC, TSH, EKG 12-Lead  Type 2 diabetes  mellitus with hyperglycemia, without long-term current use of insulin (HCC) - Plan: Hemoglobin A1c  Hypothyroidism, unspecified type - Plan: Hemoglobin A1c  Hyperlipidemia associated with type 2 diabetes mellitus (HCC) - Plan: Comprehensive metabolic panel with GFR, Lipid panel  Tolerating Zetia , continue same.  Check lipids and adjust plan accordingly.  Will check A1c for diabetes and likely will need to switch her DPP 4 to GLP 1.  Now that she has completed colonoscopy, would be amenable to that change.  Check labs first.  Updated TSH given new generic for her Synthroid .  Intermittent palpitations, brief, without associated symptoms.  Could have been related to new medication, small bit of alcohol drink may have triggered as well.  Question PVCs.  Sinus bradycardia on EKG today, RTC precautions given if symptoms recur and option of Kardia mobile device over-the-counter to try to capture rhythm.  Zio patch is also an option if symptoms recur, RTC precautions given.  No orders of the defined types were placed in this encounter.  Patient Instructions  Thank you for coming in today. No change in medications at this time. If there are any concerns on your bloodwork, I will let you know.  If palpitations continue, please follow-up with you can discuss further workup if needed.  Kardia mobile device may also provide some additional information if you do experience palpitations.  Take care!     Signed,   Reyes Pines, MD [facility], [facility] 05/20/2024 10:25 AM      [1]  Allergies Allergen Reactions   Lipitor [Atorvastatin ] Other (See Comments)    Arthralgia/myalgia.    "

## 2024-05-20 NOTE — Patient Instructions (Addendum)
 Thank you for coming in today. No change in medications at this time. If there are any concerns on your bloodwork, I will let you know.  If palpitations continue, please follow-up with me so we can discuss further workup if needed.  Kardia mobile device may also provide some additional information if you do experience palpitations.  Take care!  Palpitations Palpitations are feelings that your heartbeat is irregular or is faster than normal. It may feel like your heart is fluttering or skipping a beat. Palpitations may be caused by many things, including smoking, caffeine, alcohol, stress, and certain medicines or drugs. Most causes of palpitations are not serious.  However, some palpitations can be a sign of a serious problem. Further tests and a thorough medical history will be done to find the cause of your palpitations. Your provider may order tests such as an ECG, labs, an echocardiogram, or an ambulatory continuous ECG monitor. Follow these instructions at home: Pay attention to any changes in your symptoms. Let your health care provider know about them. Take these actions to help manage your symptoms: Eating and drinking Follow instructions from your health care provider about eating or drinking restrictions. You may need to avoid foods and drinks that may cause palpitations. These may include: Caffeinated coffee, tea, soft drinks, and energy drinks. Chocolate. Alcohol. Diet pills. Lifestyle     Take steps to reduce your stress and anxiety. Things that can help you relax include: Yoga. Mind-body activities, such as deep breathing, meditation, or using words and images to create positive thoughts (guided imagery). Physical activity, such as swimming, jogging, or walking. Tell your health care provider if your palpitations increase with activity. If you have chest pain or shortness of breath with activity, do not continue the activity until you are seen by your health care  provider. Biofeedback. This is a method that helps you learn to use your mind to control things in your body, such as your heartbeat. Get plenty of rest and sleep. Keep a regular bed time. Do not use drugs, including cocaine or ecstasy. Do not use marijuana. Do not use any products that contain nicotine or tobacco. These products include cigarettes, chewing tobacco, and vaping devices, such as e-cigarettes. If you need help quitting, ask your health care provider. General instructions Take over-the-counter and prescription medicines only as told by your health care provider. Keep all follow-up visits. This is important. These may include visits for further testing if palpitations do not go away or get worse. Contact a health care provider if: You continue to have a fast or irregular heartbeat for a long period of time. You notice that your palpitations occur more often. Get help right away if: You have chest pain or shortness of breath. You have a severe headache. You feel dizzy or you faint. These symptoms may represent a serious problem that is an emergency. Do not wait to see if the symptoms will go away. Get medical help right away. Call your local emergency services (911 in the [place] Do not drive yourself to the hospital. Summary Palpitations are feelings that your heartbeat is irregular or is faster than normal. It may feel like your heart is fluttering or skipping a beat. Palpitations may be caused by many things, including smoking, caffeine, alcohol, stress, certain medicines, and drugs. Further tests and a thorough medical history may be done to find the cause of your palpitations. Get help right away if you faint or have chest pain, shortness of breath, severe headache,  or dizziness. This information is not intended to replace advice given to you by your health care provider. Make sure you discuss any questions you have with your health care provider. Document Revised: 09/26/2020  Document Reviewed: 09/26/2020 Elsevier Patient Education  2024 [employer].

## 2024-05-21 ENCOUNTER — Ambulatory Visit: Payer: Self-pay | Admitting: Family Medicine

## 2024-05-21 DIAGNOSIS — E1169 Type 2 diabetes mellitus with other specified complication: Secondary | ICD-10-CM

## 2024-05-21 DIAGNOSIS — E1165 Type 2 diabetes mellitus with hyperglycemia: Secondary | ICD-10-CM

## 2024-05-27 ENCOUNTER — Other Ambulatory Visit (HOSPITAL_COMMUNITY): Payer: Self-pay

## 2024-05-27 ENCOUNTER — Other Ambulatory Visit: Payer: Self-pay

## 2024-05-27 MED ORDER — TIRZEPATIDE 2.5 MG/0.5ML ~~LOC~~ SOAJ
2.5000 mg | SUBCUTANEOUS | 1 refills | Status: AC
  Filled 2024-05-27: qty 2, 28d supply, fill #0
  Filled 2024-06-20: qty 2, 28d supply, fill #1

## 2024-05-27 MED ORDER — EZETIMIBE 10 MG PO TABS
10.0000 mg | ORAL_TABLET | Freq: Every day | ORAL | 1 refills | Status: AC
  Filled 2024-05-27: qty 90, 90d supply, fill #0

## 2024-05-27 NOTE — Telephone Encounter (Signed)
 Patient has Zetia  10 mg in medication list but under historical provider pended below for 90 with 1 refill,  okay to send Rx to pharmacy? Patient notes she is willing to try either as they seem to have similar side effects she does not have information about which is preferred by insurance if either.

## 2024-05-27 NOTE — Telephone Encounter (Signed)
 Scheduled 1 month appt

## 2024-06-20 ENCOUNTER — Other Ambulatory Visit: Payer: Self-pay

## 2024-06-20 ENCOUNTER — Other Ambulatory Visit (HOSPITAL_COMMUNITY): Payer: Self-pay

## 2024-06-21 ENCOUNTER — Other Ambulatory Visit: Payer: Self-pay

## 2024-06-29 ENCOUNTER — Ambulatory Visit: Admitting: Family Medicine

## 2024-06-29 DIAGNOSIS — E1169 Type 2 diabetes mellitus with other specified complication: Secondary | ICD-10-CM

## 2024-06-29 DIAGNOSIS — E039 Hypothyroidism, unspecified: Secondary | ICD-10-CM

## 2024-06-29 DIAGNOSIS — E1165 Type 2 diabetes mellitus with hyperglycemia: Secondary | ICD-10-CM

## 2024-08-31 ENCOUNTER — Ambulatory Visit: Admitting: Family Medicine
